# Patient Record
Sex: Male | Born: 1990 | Hispanic: No | Marital: Single | Smoking: Current every day smoker
Health system: Southern US, Community
[De-identification: ages and names within clinical notes are randomized; demographics above are authoritative.]

## PROBLEM LIST (undated history)

## (undated) DIAGNOSIS — R569 Unspecified convulsions: Secondary | ICD-10-CM

---
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
## 2013-12-16 ENCOUNTER — Encounter (HOSPITAL_COMMUNITY): Payer: Self-pay | Admitting: Emergency Medicine

## 2013-12-16 ENCOUNTER — Inpatient Hospital Stay (HOSPITAL_COMMUNITY)
Admission: EM | Admit: 2013-12-16 | Discharge: 2013-12-19 | DRG: 100 | Disposition: A | Discharge status: Home / Self Care | Payer: BC Managed Care – PPO | Attending: Internal Medicine | Admitting: Internal Medicine

## 2013-12-16 ENCOUNTER — Emergency Department (HOSPITAL_COMMUNITY)
Admission: EM | Admit: 2013-12-16 | Discharge: 2013-12-16 | Disposition: A | Discharge status: Home / Self Care | Payer: BC Managed Care – PPO | Attending: Emergency Medicine | Admitting: Emergency Medicine

## 2013-12-16 DIAGNOSIS — R569 Unspecified convulsions: Secondary | ICD-10-CM | POA: Diagnosis present

## 2013-12-16 DIAGNOSIS — G9340 Encephalopathy, unspecified: Secondary | ICD-10-CM | POA: Diagnosis present

## 2013-12-16 DIAGNOSIS — F1210 Cannabis abuse, uncomplicated: Secondary | ICD-10-CM | POA: Diagnosis present

## 2013-12-16 DIAGNOSIS — Z8661 Personal history of infections of the central nervous system: Secondary | ICD-10-CM

## 2013-12-16 DIAGNOSIS — D696 Thrombocytopenia, unspecified: Secondary | ICD-10-CM | POA: Diagnosis present

## 2013-12-16 DIAGNOSIS — G9349 Other encephalopathy: Secondary | ICD-10-CM | POA: Diagnosis present

## 2013-12-16 DIAGNOSIS — Z23 Encounter for immunization: Secondary | ICD-10-CM

## 2013-12-16 DIAGNOSIS — G40909 Epilepsy, unspecified, not intractable, without status epilepticus: Secondary | ICD-10-CM | POA: Diagnosis not present

## 2013-12-16 DIAGNOSIS — G40309 Generalized idiopathic epilepsy and epileptic syndromes, not intractable, without status epilepticus: Secondary | ICD-10-CM | POA: Diagnosis not present

## 2013-12-16 DIAGNOSIS — R509 Fever, unspecified: Secondary | ICD-10-CM | POA: Diagnosis present

## 2013-12-16 DIAGNOSIS — D72829 Elevated white blood cell count, unspecified: Secondary | ICD-10-CM | POA: Diagnosis present

## 2013-12-16 HISTORY — DX: Unspecified convulsions: R56.9

## 2013-12-16 LAB — BASIC METABOLIC PANEL
ANION GAP: 21 — AB (ref 5–15)
BUN: 7 mg/dL (ref 6–23)
CALCIUM: 9.5 mg/dL (ref 8.4–10.5)
CO2: 17 mEq/L — ABNORMAL LOW (ref 19–32)
Chloride: 100 mEq/L (ref 96–112)
Creatinine, Ser: 1.11 mg/dL (ref 0.50–1.35)
GFR calc Af Amer: >90 mL/min (ref >90)
GFR calc non Af Amer: >90 mL/min (ref >90)
GLUCOSE: 110 mg/dL — AB (ref 70–99)
Potassium: 5.5 mEq/L — ABNORMAL HIGH (ref 3.7–5.3)
Sodium: 138 mEq/L (ref 137–147)

## 2013-12-16 LAB — CBC WITH DIFFERENTIAL/PLATELET
BASOS PCT: 0 % (ref 0–1)
Basophils Absolute: 0 10*3/uL (ref 0.0–0.1)
EOS ABS: 0 10*3/uL (ref 0.0–0.7)
EOS PCT: 0 % (ref 0–5)
HEMATOCRIT: 49.5 % (ref 39.0–52.0)
HEMOGLOBIN: 16.4 g/dL (ref 13.0–17.0)
LYMPHS ABS: 1.4 10*3/uL (ref 0.7–4.0)
Lymphocytes Relative: 9 % — ABNORMAL LOW (ref 12–46)
MCH: 28.5 pg (ref 26.0–34.0)
MCHC: 33.1 g/dL (ref 30.0–36.0)
MCV: 86.1 fL (ref 78.0–100.0)
MONO ABS: 1.4 10*3/uL — AB (ref 0.1–1.0)
MONOS PCT: 9 % (ref 3–12)
Neutro Abs: 12.3 10*3/uL — ABNORMAL HIGH (ref 1.7–7.7)
Neutrophils Relative %: 82 % — ABNORMAL HIGH (ref 43–77)
Platelets: 160 10*3/uL (ref 150–400)
RBC: 5.75 MIL/uL (ref 4.22–5.81)
RDW: 13.7 % (ref 11.5–15.5)
WBC: 15.1 10*3/uL — ABNORMAL HIGH (ref 4.0–10.5)

## 2013-12-16 LAB — RAPID URINE DRUG SCREEN, HOSP PERFORMED
AMPHETAMINES: NOT DETECTED
BENZODIAZEPINES: NOT DETECTED
Barbiturates: NOT DETECTED
COCAINE: NOT DETECTED
OPIATES: NOT DETECTED
Tetrahydrocannabinol: POSITIVE — AB

## 2013-12-16 LAB — ETHANOL: Alcohol, Ethyl (B): <11 mg/dL (ref 0–11)

## 2013-12-16 MED ORDER — ONDANSETRON HCL 4 MG/2ML IJ SOLN
4.0000 mg | Freq: Once | INTRAMUSCULAR | Status: AC
  Administered 2013-12-16: 4 mg via INTRAVENOUS
  Filled 2013-12-16: qty 2

## 2013-12-16 NOTE — ED Provider Notes (Signed)
CSN: [id number]     Arrival date & time 12/16/13  2318 History   First MD Initiated Contact with Patient 12/16/13 2324     Chief Complaint  Patient presents with  . Seizures   Patient is a 23 y.o. male presenting with seizures.  Seizures   Patient is a 23 y.o. Male with a previous history of seizures on Keppra who presents to the ED with multiple seizures today.  Patient had 3 seizures prior to morning visit and was seen here in the ED.  Patient was observed at this time and had no further problems.  Per EMS report patient had two more seizures which occurred at home.  Per previous note review patient had 3 seizures earlier today which lasted approximately 30 seconds to a minute.  Patient was post-ictal after seizures.  Seizures did not require any intervention.  Patient is on Keppra for seizures and per previous notes patient has been taking his medications as prescribed.  There is no family at bedside on examination.  Past Medical History  Diagnosis Date  . Seizures    History reviewed. No pertinent past surgical history. History reviewed. No pertinent family history. History  Substance Use Topics  . Smoking status: Never Smoker   . Smokeless tobacco: Not on file  . Alcohol Use: Yes    Review of Systems  Neurological: Positive for seizures.    Unable to perform HPI as patient is post ictal and is not responding to questions appropriately  Allergies  Review of patient's allergies indicates no known allergies.  Home Medications   Prior to Admission medications   Medication Sig Start Date End Date Taking? Authorizing Provider  levETIRAcetam (KEPPRA) 750 MG tablet Take 3,000 mg by mouth daily.    Historical Provider, MD   BP 134/75  Pulse 83  Temp(Src) 101 F (38.3 C) (Rectal)  Resp 14  SpO2 100% Physical Exam  Nursing note and vitals reviewed. Constitutional: He appears well-developed and well-nourished. No distress.  HENT:  Head: Normocephalic and atraumatic.   Mouth/Throat: Oropharynx is clear and moist. No oropharyngeal exudate.  Eyes: Conjunctivae are normal. Pupils are equal, round, and reactive to light. No scleral icterus.  Neck: Normal range of motion. Neck supple. No JVD present. No thyromegaly present.  Cardiovascular: Normal rate, regular rhythm, normal heart sounds and intact distal pulses.  Exam reveals no gallop and no friction rub.   No murmur heard. Pulmonary/Chest: Effort normal and breath sounds normal. No respiratory distress. He has no wheezes. He has no rales. He exhibits no tenderness.  Abdominal: Soft. Bowel sounds are normal. He exhibits no distension and no mass. There is no tenderness. There is no rebound and no guarding.  Musculoskeletal: Normal range of motion.  Lymphadenopathy:    He has no cervical adenopathy.  Neurological:  GCS eyes 4, verbal response 4, motor response 6.  Patient is lethargic and appears to be confused.  Patient is largely non-communicative but occasionally will ask questions.  Patient moves extremities x 4.    Skin: Skin is warm and dry. He is not diaphoretic.  Psychiatric: He has a normal mood and affect. His behavior is normal. Judgment and thought content normal.    ED Course  Procedures (including critical care time) Labs Review Labs Reviewed  CBC WITH DIFFERENTIAL - Abnormal; Notable for the following:    WBC 16.0 (*)    Platelets 147 (*)    Neutrophils Relative % 80 (*)    Neutro Abs 12.8 (*)  Lymphocytes Relative 8 (*)    Monocytes Absolute 1.9 (*)    All other components within normal limits  BASIC METABOLIC PANEL - Abnormal; Notable for the following:    GFR calc non Af Amer 87 (*)    Anion gap 20 (*)    All other components within normal limits  URINALYSIS, ROUTINE W REFLEX MICROSCOPIC  BLOOD GAS, VENOUS  I-STAT CG4 LACTIC ACID, ED    Imaging Review No results found.   EKG Interpretation None      MDM   Final diagnoses:  Seizures   Patient is a 23 y.o. Male who  presents to the ED via EMS for recurrent seizures today.  Patient is post ictal and confused on examination.  History is difficult to obtain due to the fact that there is no family at bedside.  CBC, CMP, lactic acid, and UA ordered here at this time and are currently pending.  Have started an IV and will give the patient NS bolus and will treat with 1g IV keppra here at this time.    1:17 am Patient was rechecked and continues to remain altered and confused.  Patient will not urinate at this time.  Patient to be in and out cathed by nursing.  Patient does appear to have an increasing leukocytosis with an anion gap of 20.  Have spoken with Dr. Anise Salvo at this time who has advised the use of 100 mg of IV vimpat and recommends at least an observation admission.  It is his preference to have th patient transferred to [facility].   2:15 am I spoke with Dr. Toniann Fail who will come to see the patient here in the ED.  Accepting physician at [facility] will likely be Dr. Allena Katz.  [facility] to be transferred [facility] for [facility] treatment.  EMTALA has been performed by me.  2:20 am Patient is now febrile and as the patient likely has not returned to baseline and has a leukocytosis patient needs a CT of the head and an LP.  I have spoken with Dr. Norlene Campbell and have signed the patient out with her at this time.        Eben Burow, PA-C 12/17/13 0222

## 2013-12-16 NOTE — ED Provider Notes (Signed)
CSN: [id number]     Arrival date & time 12/16/13  4098 History   First MD Initiated Contact with Patient 12/16/13 1004     Chief Complaint  Patient presents with  . Seizures     (Consider location/radiation/quality/duration/timing/severity/associated sxs/prior Treatment) HPI Comments: Patient here after having 3 seizures witnessed by family each lasting for possibly 30 seconds to 1 minute. He has a history of seizure disorder and takes Keppra and reportedly has been compliant. EMS arrived and patient was post ictal. Patient denies any recent illnesses. He has had some vomiting that began after he had the seizures. Denies any tongue injury. No loss of bladder function. No focal weakness noted. No treatment used prior to arrival and nothing makes the symptoms better or worse.  Patient is a 23 y.o. male presenting with seizures. The history is provided by the patient.  Seizures   Past Medical History  Diagnosis Date  . Seizures    History reviewed. No pertinent past surgical history. No family history on file. History  Substance Use Topics  . Smoking status: Not on file  . Smokeless tobacco: Not on file  . Alcohol Use: Yes    Review of Systems  Neurological: Positive for seizures.  All other systems reviewed and are negative.     Allergies  Review of patient's allergies indicates no known allergies.  Home Medications   Prior to Admission medications   Not on File   BP 112/55  Pulse 85  Temp(Src) 98.1 F (36.7 C) (Oral)  Resp 18  SpO2 100% Physical Exam  Nursing note and vitals reviewed. Constitutional: He is oriented to person, place, and time. He appears well-developed and well-nourished.  Non-toxic appearance. No distress.  HENT:  Head: Normocephalic and atraumatic.  Eyes: Conjunctivae, EOM and lids are normal. Pupils are equal, round, and reactive to light.  Neck: Normal range of motion. Neck supple. No tracheal deviation present. No mass present.   Cardiovascular: Normal rate, regular rhythm and normal heart sounds.  Exam reveals no gallop.   No murmur heard. Pulmonary/Chest: Effort normal and breath sounds normal. No stridor. No respiratory distress. He has no decreased breath sounds. He has no wheezes. He has no rhonchi. He has no rales.  Abdominal: Soft. Normal appearance and bowel sounds are normal. He exhibits no distension. There is no tenderness. There is no rebound and no CVA tenderness.  Musculoskeletal: Normal range of motion. He exhibits no edema and no tenderness.  Neurological: He is alert and oriented to person, place, and time. He has normal strength. No cranial nerve deficit or sensory deficit. GCS eye subscore is 4. GCS verbal subscore is 5. GCS motor subscore is 6.  Skin: Skin is warm and dry. No abrasion and no rash noted.  Psychiatric: His affect is blunt. His speech is delayed. He is slowed.    ED Course  Procedures (including critical care time) Labs Review Labs Reviewed  CBC WITH DIFFERENTIAL  BASIC METABOLIC PANEL  URINE RAPID DRUG SCREEN (HOSP PERFORMED)  ETHANOL    Imaging Review No results found.   EKG Interpretation None      MDM   Final diagnoses:  None    Patient monitored here no further seizure activity. Spoke with patient's mother and patient recently had his dose of Keppra increased by his neurologist. She will contact him for medication adjustment instructions. Neurological exam stable for discharge.  Toy Baker, MD 12/16/13 1259

## 2013-12-16 NOTE — ED Notes (Signed)
Patient was seen today in our ED for seizures, treated and discharged. Patient has had 2 more seizures at home after discharge. EMS was called.

## 2013-12-16 NOTE — Discharge Instructions (Signed)
Call your Dr. today to have them adjust your dose of Keppra return here if you have another seizure Epilepsy Epilepsy is a disorder in which a person has repeated seizures over time. A seizure is a release of abnormal electrical activity in the brain. Seizures can cause a change in attention, behavior, or the ability to remain awake and alert (altered mental status). Seizures often involve uncontrollable shaking (convulsions).  Most people with epilepsy lead normal lives. However, people with epilepsy are at an increased risk of falls, accidents, and injuries. Therefore, it is important to begin treatment right away. CAUSES  Epilepsy has many possible causes. Anything that disturbs the normal pattern of brain cell activity can lead to seizures. This may include:   Head injury.  Birth trauma.  High fever as a child.  Stroke.  Bleeding into or around the brain.  Certain drugs.  Prolonged low oxygen, such as what occurs after CPR efforts.  Abnormal brain development.  Certain illnesses, such as meningitis, encephalitis (brain infection), malaria, and other infections.  An imbalance of nerve signaling chemicals (neurotransmitters).  SIGNS AND SYMPTOMS  The symptoms of a seizure can vary greatly from one person to another. Right before a seizure, you may have a warning (aura) that a seizure is about to occur. An aura may include the following symptoms:  Fear or anxiety.  Nausea.  Feeling like the room is spinning (vertigo).  Vision changes, such as seeing flashing lights or spots. Common symptoms during a seizure include:  Abnormal sensations, such as an abnormal smell or a bitter taste in the mouth.   Sudden, general body stiffness.   Convulsions that involve rhythmic jerking of the face, arm, or leg on one or both sides.   Sudden change in consciousness.   Appearing to be awake but not responding.   Appearing to be asleep but cannot be awakened.   Grimacing,  chewing, lip smacking, drooling, tongue biting, or loss of bowel or bladder control. After a seizure, you may feel sleepy for a while. DIAGNOSIS  Your health care provider will ask about your symptoms and take a medical history. Descriptions from any witnesses to your seizures will be very helpful in the diagnosis. A physical exam, including a detailed neurological exam, is necessary. Various tests may be done, such as:   An electroencephalogram (EEG). This is a painless test of your brain waves. In this test, a diagram is created of your brain waves. These diagrams can be interpreted by a specialist.  An MRI of the brain.   A CT scan of the brain.   A spinal tap (lumbar puncture, LP).  Blood tests to check for signs of infection or abnormal blood chemistry. TREATMENT  There is no cure for epilepsy, but it is generally treatable. Once epilepsy is diagnosed, it is important to begin treatment as soon as possible. For most people with epilepsy, seizures can be controlled with medicines. The following may also be used:  A pacemaker for the brain (vagus nerve stimulator) can be used for people with seizures that are not well controlled by medicine.  Surgery on the brain. For some people, epilepsy eventually goes away. HOME CARE INSTRUCTIONS   Follow your health care provider's recommendations on driving and safety in normal activities.  Get enough rest. Lack of sleep can cause seizures.  Only take over-the-counter or prescription medicines as directed by your health care provider. Take any prescribed medicine exactly as directed.  Avoid any known triggers of your seizures.  Keep a seizure diary. Record what you recall about any seizure, especially any possible trigger.   Make sure the people you live and work with know that you are prone to seizures. They should receive instructions on how to help you. In general, a witness to a seizure should:   Cushion your head and body.    Turn you on your side.   Avoid unnecessarily restraining you.   Not place anything inside your mouth.   Call for emergency medical help if there is any question about what has occurred.   Follow up with your health care provider as directed. You may need regular blood tests to monitor the levels of your medicine.  SEEK MEDICAL CARE IF:   You develop signs of infection or other illness. This might increase the risk of a seizure.   You seem to be having more frequent seizures.   Your seizure pattern is changing.  SEEK IMMEDIATE MEDICAL CARE IF:   You have a seizure that does not stop after a few moments.   You have a seizure that causes any difficulty in breathing.   You have a seizure that results in a very severe headache.   You have a seizure that leaves you with the inability to speak or use a part of your body.  Document Released: 04/10/2005 Document Revised: 01/29/2013 Document Reviewed: 11/20/2012 [facility] Patient Information 2015 [place], [place]. This information is not intended to replace advice given to you by your health care provider. Make sure you discuss any questions you have with your health care provider.

## 2013-12-16 NOTE — ED Notes (Signed)
Bed: ZO10 Expected date:  Expected time:  Means of arrival:  Comments: ems- seizure

## 2013-12-16 NOTE — ED Notes (Signed)
MD at bedside. 

## 2013-12-16 NOTE — ED Notes (Addendum)
Per EMS: family states that since 0640 this morning pt has had 3 seizures, lasting 30sec - . Has hx of seizures. Per family pt is taking Keppra as prescribed. Pt also having vomiting.

## 2013-12-16 NOTE — ED Notes (Signed)
Pt. Is unable to use the restroom at this time, but is aware that we need a urine specimen. Urinal at the bedside.  

## 2013-12-16 NOTE — Progress Notes (Signed)
  CARE MANAGEMENT ED NOTE 12/16/2013  Patient:  Jack, Suarez   Account Number:  [id number]  Date Initiated:  12/16/2013  Documentation initiated by:  Edd Arbour  Subjective/Objective Assessment:   23 yr old self pay pt (male at bedside reports pt has insurance but none listed- encouraged him to call mother, family to obtain information to be provided to registration to prevent self pay billing)     Subjective/Objective Assessment Detail:   pcp per male at bedside [place] in [place], [place] and it is a male  Cm offered to help find a pcp but male states pt returns to his pcp in [place] but does not know male dr name Does not have insurance card nor copy of insurance card    family states that since 0640 this morning pt has had 3 seizures, lasting 30sec - . Has hx of seizures. Per family pt is taking Keppra as prescribed. Pt also having vomiting     Action/Plan:   ED CM noted pt with out insurance not pcp listed ECD CM left male calling someone on his cell to obtain information to provide to ED registration   Action/Plan Detail:   Anticipated DC Date:  12/16/2013     Status Recommendation to Physician:   Result of Recommendation:    Other ED Services  Consult Working Plan    DC Planning Services  Other  PCP issues  Outpatient Services - Pt will follow up    Choice offered to / List presented to:            Status of service:  Completed, signed off  ED Comments:   ED Comments Detail:

## 2013-12-16 NOTE — ED Notes (Signed)
Bed: ZO10 Expected date: 12/16/13 Expected time: 11:06 PM Means of arrival: Ambulance Comments: Seizures/confused

## 2013-12-17 ENCOUNTER — Inpatient Hospital Stay (HOSPITAL_COMMUNITY): Payer: BC Managed Care – PPO

## 2013-12-17 ENCOUNTER — Emergency Department (HOSPITAL_COMMUNITY): Payer: BC Managed Care – PPO

## 2013-12-17 ENCOUNTER — Encounter (HOSPITAL_COMMUNITY): Payer: Self-pay | Admitting: Internal Medicine

## 2013-12-17 DIAGNOSIS — G40309 Generalized idiopathic epilepsy and epileptic syndromes, not intractable, without status epilepticus: Secondary | ICD-10-CM | POA: Diagnosis present

## 2013-12-17 DIAGNOSIS — R509 Fever, unspecified: Secondary | ICD-10-CM | POA: Diagnosis present

## 2013-12-17 DIAGNOSIS — F1210 Cannabis abuse, uncomplicated: Secondary | ICD-10-CM | POA: Diagnosis present

## 2013-12-17 DIAGNOSIS — R569 Unspecified convulsions: Secondary | ICD-10-CM | POA: Diagnosis present

## 2013-12-17 DIAGNOSIS — G40909 Epilepsy, unspecified, not intractable, without status epilepticus: Secondary | ICD-10-CM | POA: Diagnosis present

## 2013-12-17 DIAGNOSIS — D72829 Elevated white blood cell count, unspecified: Secondary | ICD-10-CM | POA: Diagnosis present

## 2013-12-17 DIAGNOSIS — Z23 Encounter for immunization: Secondary | ICD-10-CM | POA: Diagnosis not present

## 2013-12-17 DIAGNOSIS — G9349 Other encephalopathy: Secondary | ICD-10-CM | POA: Diagnosis present

## 2013-12-17 DIAGNOSIS — Z8661 Personal history of infections of the central nervous system: Secondary | ICD-10-CM | POA: Diagnosis not present

## 2013-12-17 DIAGNOSIS — D696 Thrombocytopenia, unspecified: Secondary | ICD-10-CM | POA: Diagnosis present

## 2013-12-17 DIAGNOSIS — G9340 Encephalopathy, unspecified: Secondary | ICD-10-CM | POA: Diagnosis present

## 2013-12-17 LAB — URINALYSIS, ROUTINE W REFLEX MICROSCOPIC
BILIRUBIN URINE: NEGATIVE
GLUCOSE, UA: NEGATIVE mg/dL
Hgb urine dipstick: NEGATIVE
KETONES UR: 40 mg/dL — AB
Leukocytes, UA: NEGATIVE
Nitrite: NEGATIVE
PH: 5.5 (ref 5.0–8.0)
Protein, ur: NEGATIVE mg/dL
Specific Gravity, Urine: 1.014 (ref 1.005–1.030)
Urobilinogen, UA: 0.2 mg/dL (ref 0.0–1.0)

## 2013-12-17 LAB — BLOOD GAS, VENOUS
Acid-base deficit: 1.9 mmol/L (ref 0.0–2.0)
BICARBONATE: 22.1 meq/L (ref 20.0–24.0)
FIO2: 0.21 %
O2 Saturation: 76 %
PCO2 VEN: 39.9 mmHg — AB (ref 45.0–50.0)
PH VEN: 7.37 — AB (ref 7.250–7.300)
Patient temperature: 101
TCO2: 19 mmol/L (ref 0–100)
pO2, Ven: 45.1 mmHg — ABNORMAL HIGH (ref 30.0–45.0)

## 2013-12-17 LAB — GLUCOSE, CAPILLARY
GLUCOSE-CAPILLARY: 86 mg/dL (ref 70–99)
Glucose-Capillary: 85 mg/dL (ref 70–99)
Glucose-Capillary: 76 mg/dL (ref 70–99)
Glucose-Capillary: 73 mg/dL (ref 70–99)

## 2013-12-17 LAB — LACTIC ACID, PLASMA: Lactic Acid, Venous: 1.3 mmol/L (ref 0.5–2.2)

## 2013-12-17 LAB — CBC WITH DIFFERENTIAL/PLATELET
BASOS ABS: 0 10*3/uL (ref 0.0–0.1)
BASOS PCT: 0 % (ref 0–1)
Basophils Absolute: 0 10*3/uL (ref 0.0–0.1)
Basophils Relative: 0 % (ref 0–1)
EOS ABS: 0 10*3/uL (ref 0.0–0.7)
EOS PCT: 0 % (ref 0–5)
Eosinophils Absolute: 0 10*3/uL (ref 0.0–0.7)
Eosinophils Relative: 0 % (ref 0–5)
HCT: 44.3 % (ref 39.0–52.0)
HEMATOCRIT: 50 % (ref 39.0–52.0)
Hemoglobin: 15.4 g/dL (ref 13.0–17.0)
Hemoglobin: 17 g/dL (ref 13.0–17.0)
LYMPHS PCT: 8 % — AB (ref 12–46)
Lymphocytes Relative: 18 % (ref 12–46)
Lymphs Abs: 1.3 10*3/uL (ref 0.7–4.0)
Lymphs Abs: 2.8 10*3/uL (ref 0.7–4.0)
MCH: 29.7 pg (ref 26.0–34.0)
MCH: 29.9 pg (ref 26.0–34.0)
MCHC: 34 g/dL (ref 30.0–36.0)
MCHC: 34.8 g/dL (ref 30.0–36.0)
MCV: 86 fL (ref 78.0–100.0)
MCV: 87.4 fL (ref 78.0–100.0)
MONO ABS: 1.9 10*3/uL — AB (ref 0.1–1.0)
MONOS PCT: 13 % — AB (ref 3–12)
Monocytes Absolute: 2 10*3/uL — ABNORMAL HIGH (ref 0.1–1.0)
Monocytes Relative: 12 % (ref 3–12)
NEUTROS PCT: 69 % (ref 43–77)
Neutro Abs: 10.3 10*3/uL — ABNORMAL HIGH (ref 1.7–7.7)
Neutro Abs: 12.8 10*3/uL — ABNORMAL HIGH (ref 1.7–7.7)
Neutrophils Relative %: 80 % — ABNORMAL HIGH (ref 43–77)
Platelets: 140 10*3/uL — ABNORMAL LOW (ref 150–400)
Platelets: 147 10*3/uL — ABNORMAL LOW (ref 150–400)
RBC: 5.15 MIL/uL (ref 4.22–5.81)
RBC: 5.72 MIL/uL (ref 4.22–5.81)
RDW: 13.7 % (ref 11.5–15.5)
RDW: 13.7 % (ref 11.5–15.5)
WBC: 15.1 10*3/uL — ABNORMAL HIGH (ref 4.0–10.5)
WBC: 16 10*3/uL — AB (ref 4.0–10.5)

## 2013-12-17 LAB — COMPREHENSIVE METABOLIC PANEL
ALK PHOS: 46 U/L (ref 39–117)
ALT: 16 U/L (ref 0–53)
AST: 66 U/L — ABNORMAL HIGH (ref 0–37)
Albumin: 3.6 g/dL (ref 3.5–5.2)
Anion gap: 14 (ref 5–15)
BUN: 7 mg/dL (ref 6–23)
CHLORIDE: 104 meq/L (ref 96–112)
CO2: 21 meq/L (ref 19–32)
Calcium: 8.6 mg/dL (ref 8.4–10.5)
Creatinine, Ser: 1.12 mg/dL (ref 0.50–1.35)
GFR calc non Af Amer: >90 mL/min (ref >90)
GLUCOSE: 90 mg/dL (ref 70–99)
Potassium: 3.7 mEq/L (ref 3.7–5.3)
SODIUM: 139 meq/L (ref 137–147)
Total Bilirubin: 0.7 mg/dL (ref 0.3–1.2)
Total Protein: 6.7 g/dL (ref 6.0–8.3)

## 2013-12-17 LAB — BASIC METABOLIC PANEL
Anion gap: 20 — ABNORMAL HIGH (ref 5–15)
BUN: 8 mg/dL (ref 6–23)
CALCIUM: 9.4 mg/dL (ref 8.4–10.5)
CO2: 19 meq/L (ref 19–32)
CREATININE: 1.18 mg/dL (ref 0.50–1.35)
Chloride: 100 mEq/L (ref 96–112)
GFR calc Af Amer: >90 mL/min (ref >90)
GFR calc non Af Amer: 87 mL/min — ABNORMAL LOW (ref >90)
GLUCOSE: 90 mg/dL (ref 70–99)
Potassium: 4.6 mEq/L (ref 3.7–5.3)
Sodium: 139 mEq/L (ref 137–147)

## 2013-12-17 LAB — I-STAT CG4 LACTIC ACID, ED: Lactic Acid, Venous: 3.74 mmol/L — ABNORMAL HIGH (ref 0.5–2.2)

## 2013-12-17 LAB — MRSA PCR SCREENING: MRSA by PCR: NEGATIVE

## 2013-12-17 LAB — URINE MICROSCOPIC-ADD ON

## 2013-12-17 MED ORDER — ACETAMINOPHEN 325 MG PO TABS
650.0000 mg | ORAL_TABLET | Freq: Four times a day (QID) | ORAL | Status: DC | PRN
  Administered 2013-12-18: 650 mg via ORAL
  Filled 2013-12-17: qty 2

## 2013-12-17 MED ORDER — SODIUM CHLORIDE 0.9 % IV SOLN
INTRAVENOUS | Status: DC
  Administered 2013-12-17: 75 mL/h via INTRAVENOUS

## 2013-12-17 MED ORDER — ACETAMINOPHEN 650 MG RE SUPP: 650.0000 mg | Freq: Four times a day (QID) | RECTAL | Status: DC | PRN

## 2013-12-17 MED ORDER — VALPROATE SODIUM 500 MG/5ML IV SOLN
500.0000 mg | Freq: Three times a day (TID) | INTRAVENOUS | Status: DC
  Administered 2013-12-17 – 2013-12-18 (×3): 500 mg via INTRAVENOUS
  Filled 2013-12-17 (×4): qty 5

## 2013-12-17 MED ORDER — DEXTROSE 5 % IV SOLN
2.0000 g | Freq: Once | INTRAVENOUS | Status: AC
  Administered 2013-12-17: 2 g via INTRAVENOUS
  Filled 2013-12-17: qty 2

## 2013-12-17 MED ORDER — VANCOMYCIN HCL IN DEXTROSE 1-5 GM/200ML-% IV SOLN
1000.0000 mg | Freq: Once | INTRAVENOUS | Status: DC
  Filled 2013-12-17: qty 200

## 2013-12-17 MED ORDER — PROPOFOL 10 MG/ML IV BOLUS
INTRAVENOUS | Status: AC
  Filled 2013-12-17: qty 1

## 2013-12-17 MED ORDER — LEVETIRACETAM IN NACL 1000 MG/100ML IV SOLN
1000.0000 mg | Freq: Once | INTRAVENOUS | Status: AC
  Administered 2013-12-17: 1000 mg via INTRAVENOUS
  Filled 2013-12-17: qty 100

## 2013-12-17 MED ORDER — DEXTROSE 5 % IV SOLN
2.0000 g | Freq: Two times a day (BID) | INTRAVENOUS | Status: DC
  Administered 2013-12-18 (×2): 2 g via INTRAVENOUS
  Filled 2013-12-17 (×3): qty 2

## 2013-12-17 MED ORDER — SODIUM CHLORIDE 0.9 % IV BOLUS (SEPSIS)
1000.0000 mL | Freq: Once | INTRAVENOUS | Status: AC
  Administered 2013-12-17: 1000 mL via INTRAVENOUS

## 2013-12-17 MED ORDER — ONDANSETRON HCL 4 MG PO TABS: 4.0000 mg | ORAL_TABLET | Freq: Four times a day (QID) | ORAL | Status: DC | PRN

## 2013-12-17 MED ORDER — VANCOMYCIN HCL IN DEXTROSE 1-5 GM/200ML-% IV SOLN
1000.0000 mg | Freq: Three times a day (TID) | INTRAVENOUS | Status: DC
  Administered 2013-12-17 – 2013-12-18 (×4): 1000 mg via INTRAVENOUS
  Filled 2013-12-17 (×7): qty 200

## 2013-12-17 MED ORDER — DEXTROSE 5 % IV SOLN
10.0000 mg/kg | Freq: Three times a day (TID) | INTRAVENOUS | Status: DC
  Administered 2013-12-17 – 2013-12-18 (×5): 750 mg via INTRAVENOUS
  Filled 2013-12-17 (×9): qty 15

## 2013-12-17 MED ORDER — PNEUMOCOCCAL VAC POLYVALENT 25 MCG/0.5ML IJ INJ
0.5000 mL | INJECTION | INTRAMUSCULAR | Status: DC
  Filled 2013-12-17 (×2): qty 0.5

## 2013-12-17 MED ORDER — LEVETIRACETAM IN NACL 1500 MG/100ML IV SOLN
1500.0000 mg | Freq: Two times a day (BID) | INTRAVENOUS | Status: DC
  Administered 2013-12-17 – 2013-12-18 (×3): 1500 mg via INTRAVENOUS
  Filled 2013-12-17 (×4): qty 100

## 2013-12-17 MED ORDER — PROPOFOL 10 MG/ML IV BOLUS
INTRAVENOUS | Status: AC | PRN
  Administered 2013-12-17: 30 mg via INTRAVENOUS

## 2013-12-17 MED ORDER — KETAMINE HCL 10 MG/ML IJ SOLN
INTRAMUSCULAR | Status: AC | PRN
  Administered 2013-12-17: 110 mg via INTRAVENOUS

## 2013-12-17 MED ORDER — VALPROATE SODIUM 500 MG/5ML IV SOLN
500.0000 mg | Freq: Three times a day (TID) | INTRAVENOUS | Status: DC
  Administered 2013-12-17: 500 mg via INTRAVENOUS
  Filled 2013-12-17 (×5): qty 5

## 2013-12-17 MED ORDER — LORAZEPAM 2 MG/ML IJ SOLN
1.0000 mg | INTRAMUSCULAR | Status: DC | PRN
  Filled 2013-12-17 (×2): qty 1

## 2013-12-17 MED ORDER — KETAMINE HCL 10 MG/ML IJ SOLN: 1.5000 mg/kg | Freq: Once | INTRAMUSCULAR | Status: DC

## 2013-12-17 MED ORDER — SODIUM CHLORIDE 0.9 % IV SOLN: 100.0000 mg | Freq: Two times a day (BID) | INTRAVENOUS | Status: DC

## 2013-12-17 MED ORDER — SODIUM CHLORIDE 0.9 % IV SOLN
100.0000 mg | Freq: Two times a day (BID) | INTRAVENOUS | Status: DC
  Administered 2013-12-17: 100 mg via INTRAVENOUS
  Filled 2013-12-17 (×3): qty 10

## 2013-12-17 MED ORDER — ONDANSETRON HCL 4 MG/2ML IJ SOLN: 4.0000 mg | Freq: Four times a day (QID) | INTRAMUSCULAR | Status: DC | PRN

## 2013-12-17 MED ORDER — SODIUM CHLORIDE 0.9 % IV SOLN
INTRAVENOUS | Status: AC | PRN
  Administered 2013-12-17: 50 mL/h via INTRAVENOUS

## 2013-12-17 MED ORDER — SODIUM CHLORIDE 0.9 % IV SOLN
INTRAVENOUS | Status: DC
  Administered 2013-12-17: 07:00:00 via INTRAVENOUS

## 2013-12-17 MED ORDER — PROPOFOL 10 MG/ML IV BOLUS
0.5000 mg/kg | Freq: Once | INTRAVENOUS | Status: DC
  Filled 2013-12-17: qty 1

## 2013-12-17 MED ORDER — LORAZEPAM 2 MG/ML IJ SOLN
1.0000 mg | Freq: Once | INTRAMUSCULAR | Status: AC
  Administered 2013-12-17: 1 mg via INTRAVENOUS
  Filled 2013-12-17: qty 1

## 2013-12-17 MED ORDER — PROPOFOL 10 MG/ML IV BOLUS
INTRAVENOUS | Status: AC | PRN
  Administered 2013-12-17: 100 mg via INTRAVENOUS

## 2013-12-17 MED ORDER — PROPOFOL 10 MG/ML IV BOLUS
INTRAVENOUS | Status: AC | PRN
  Administered 2013-12-17: 70 mg via INTRAVENOUS

## 2013-12-17 NOTE — Progress Notes (Signed)
ANTIBIOTIC CONSULT NOTE - INITIAL  Pharmacy Consult for Acyclovir Indication: concern for encephalitis   No Known Allergies  Patient Measurements: Height:  (185.4 cm) Weight: 165 lb (74.844 kg) IBW/kg (Calculated) : 79.9 Adjusted Body Weight:   Vital Signs: Temp: 101 F (38.3 C) (08/26 0135) Temp src: Rectal (08/26 0135) BP: 107/55 mmHg (08/26 0200) Pulse Rate: 70 (08/26 0200) Intake/Output from previous day:   Intake/Output from this shift:    Labs:  Recent Labs  12/16/13 1025 12/17/13 0027  WBC 15.1* 16.0*  HGB 16.4 17.0  PLT 160 147*  CREATININE 1.11 1.18   Estimated Creatinine Clearance: 104.8 ml/min (by C-G formula based on Cr of 1.18). No results found for this basename: VANCOTROUGH, VANCOPEAK, VANCORANDOM, GENTTROUGH, GENTPEAK, GENTRANDOM, TOBRATROUGH, TOBRAPEAK, TOBRARND, AMIKACINPEAK, AMIKACINTROU, AMIKACIN,  in the last 72 hours   Microbiology: No results found for this or any previous visit (from the past 720 hour(s)).  Medical History: Past Medical History  Diagnosis Date  . Seizures     Medications:  Anti-infectives   Start     Dose/Rate Route Frequency Ordered Stop   12/17/13 0330  acyclovir (ZOVIRAX) 750 mg in dextrose 5 % 150 mL IVPB     10 mg/kg  74.8 kg 165 mL/hr over 60 Minutes Intravenous 3 times per day 12/17/13 0319     12/17/13 0245  vancomycin (VANCOCIN) IVPB 1000 mg/200 mL premix     1,000 mg 200 mL/hr over 60 Minutes Intravenous  Once 12/17/13 0232     12/17/13 0245  cefTRIAXone (ROCEPHIN) 2 g in dextrose 5 % 50 mL IVPB     2 g 100 mL/hr over 30 Minutes Intravenous  Once 12/17/13 0232       Assessment: Patient with concern for encephalitis.    Goal of Therapy:  Acyclovir dosed based on patient weight and renal function   Plan:  Acyclovir  iv q8hr  Darlina Guys, Abdon Petrosky [place] 12/17/2013,3:19 AM

## 2013-12-17 NOTE — ED Provider Notes (Signed)
Medical screening examination/treatment/procedure(s) were conducted as a shared visit with non-physician practitioner(s) and myself.  I personally evaluated the patient during the encounter.   EKG Interpretation None     Please see my note from this date  Olivia Mackie, MD 12/17/13 [phone]

## 2013-12-17 NOTE — Progress Notes (Signed)
ANTIBIOTIC CONSULT NOTE - INITIAL  Pharmacy Consult for Vancomycin/Ceftriaxone  Indication: r/o meningitis  No Known Allergies  Patient Measurements: Height:  (185.4 cm) Weight: 165 lb (74.844 kg) IBW/kg (Calculated) : 79.9  Vital Signs: Temp: 101 F (38.3 C) (08/26 0135) Temp src: Rectal (08/26 0135) BP: 124/62 mmHg (08/26 0523) Pulse Rate: 95 (08/26 0523)  Labs:  Recent Labs  12/16/13 1025 12/17/13 0027  WBC 15.1* 16.0*  HGB 16.4 17.0  PLT 160 147*  CREATININE 1.11 1.18   Estimated Creatinine Clearance: 104.8 ml/min (by C-G formula based on Cr of 1.18).  Medical History: Past Medical History  Diagnosis Date  . Seizures     Assessment: 23 y/o M to start empiric antibiotics for r/o meningitis. Already on acyclovir. WBC 16, Tmax 101, other labs as above.   Goal of Therapy:  Vancomycin trough level 15-20 mcg/ml  Plan:  -Vancomycin 1000 mg IV q8h -Ceftriaxone 2g IV q12h -Trend WBC, temp, renal function  -Drug levels as indicated -F/U meningitis w/u  Abran Duke 12/17/2013,6:28 AM

## 2013-12-17 NOTE — H&P (Signed)
[facility] History and Physical  Jack Suarez WUJ:[id number] DOB: May 11, 1990 DOA: 12/16/2013  Referring physician: ER physician. PCP: No primary provider on file.   History obtained from ER physician and patient's mother as patient is post ictal and confused.  Chief Complaint: Seizures.  HPI: Jack Suarez is a 23 y.o. male with history of seizures was brought to the ER after patient had seizures at his house. Patient had originally come to the ER yesterday morning for seizures and was observed in the ER and discharged home. Following which patient had 2 more episodes of seizures which as per patient's mother lasted for less than a minute each and was tonic-clonic. EMS was called and patient was brought to the ER. On-call neurologist Dr. Amada Jupiter was consulted by EDP and patient was given a loading dose of Keppra and Dr. Amada Jupiter advised to add Vimpat and lumbar puncture. Patient is mildly febrile. Lumbar puncture has been planned and is pending. CT head did not show anything acute but did show parietal bone thinning. As per patient's mother patient's neurologist had recently increased patient's Keppra to 3000 mg daily 2 weeks ago since patient was having increased frequency of seizures. Patient has history of meningitis when he was 27 months old.  Review of Systems: As presented in the history of presenting illness, rest negative.  Past Medical History  Diagnosis Date  . Seizures    History reviewed. No pertinent past surgical history. Social History:  reports that he has never smoked. He does not have any smokeless tobacco history on file. He reports that he drinks alcohol. His drug history is not on file. Where does patient live home. Can patient participate in ADLs? Yes.  No Known Allergies  Family History:  Family History  Problem Relation Age of Onset  . Family history unknown: Yes      Prior to Admission medications   Medication Sig Start Date End Date  Taking? Authorizing Provider  levETIRAcetam (KEPPRA) 750 MG tablet Take 3,000 mg by mouth daily.    Historical Provider, MD    Physical Exam: Filed Vitals:   12/17/13 0130 12/17/13 0135 12/17/13 0200 12/17/13 0314  BP: 123/83  107/55   Pulse:   70   Temp:  101 F (38.3 C)    TempSrc:  Rectal    Resp: 25  23   Height:     (1.854 m)  Weight:    74.844 kg (165 lb)  SpO2:   98%      General:  Well-developed and nourished.  Eyes: Perla positive. Anicteric no pallor.  ENT: No discharge from the ears eyes nose mouth.  Neck: No mass felt. No neck rigidity.  Cardiovascular: S1-S2 heard.  Respiratory: No rhonchi or crepitations.  Abdomen: Soft nontender bowel sounds present. No guarding rigidity.  Skin: No rash.  Musculoskeletal: No edema.  Psychiatric: Patient is lethargic and postictal.  Neurologic: Patient is lethargic and postictal. Difficult to assess neurologically.  Labs on Admission:  Basic Metabolic Panel:  Recent Labs Lab 12/16/13 1025 12/17/13 0027  NA 138 139  K 5.5* 4.6  CL 100 100  CO2 17* 19  GLUCOSE 110* 90  BUN 7 8  CREATININE 1.11 1.18  CALCIUM 9.5 9.4   Liver Function Tests: No results found for this basename: AST, ALT, ALKPHOS, BILITOT, PROT, ALBUMIN,  in the last 168 hours No results found for this basename: LIPASE, AMYLASE,  in the last 168 hours No results found for this basename: AMMONIA,  in the  last 168 hours CBC:  Recent Labs Lab 12/16/13 1025 12/17/13 0027  WBC 15.1* 16.0*  NEUTROABS 12.3* 12.8*  HGB 16.4 17.0  HCT 49.5 50.0  MCV 86.1 87.4  PLT 160 147*   Cardiac Enzymes: No results found for this basename: CKTOTAL, CKMB, CKMBINDEX, TROPONINI,  in the last 168 hours  BNP (last 3 results) No results found for this basename: PROBNP,  in the last 8760 hours CBG: No results found for this basename: GLUCAP,  in the last 168 hours  Radiological Exams on Admission: Ct Head Wo Contrast  12/17/2013   CLINICAL DATA:   Multiple seizures.  EXAM: CT HEAD WITHOUT CONTRAST  TECHNIQUE: Contiguous axial images were obtained from the base of the skull through the vertex without intravenous contrast.  COMPARISON:  CT head October 04, 2013, CT head January 04, 2010  FINDINGS: The ventricles and sulci are normal. No intraparenchymal hemorrhage, mass effect nor midline shift. Suspected cortical thinning of the right parietal lobe, for example axial 21/33. No acute large vascular territory infarcts.  No abnormal extra-axial fluid collections. Basal cisterns are patent.  No skull fracture. The included ocular globes and orbital contents are non-suspicious. The mastoid aircells and included paranasal sinuses are well-aerated.  IMPRESSION: Motion degraded examination, no acute intracranial process.  Suspected cortical thinning of the right parietal lobe, this may reflect encephalomalacia/remote traumatic etiology. Findings would be better characterized on MRI of the brain on a nonemergent basis.   Electronically Signed   By: Awilda Metro   On: 12/17/2013 03:24     Assessment/Plan Principal Problem:   Seizures Active Problems:   Leucocytosis   Encephalopathy acute   1. Seizures - I have discussed with on-call neurologist Dr. Amada Jupiter who has advised to continue with Keppra 1500 mg IV q 12 hourly and Vimpat 100 mg IV every every 12 hourly. Lumbar puncture labs are pending. Further recommendations per neurologist. Patient has been empirically placed on antibiotics for meningitis/encephalitis. Follow lumbar puncture labs. MRI brain. When necessary Ativan for seizures. Patient will be transferred [facility] for further management. Patient's mother is in agreement to transfer. Dr. Allena Katz will be the accepting physician. Further recommendations per neurologist. 2. Acute encephalopathy - probably secondary to seizures and postictal state. 3. Leukocytosis - probably reactionary. Patient is presently been on antibiotics.  Follow CBC and lumbar puncture labs. 4. Mild thrombocytopenia - closely follow CBC. 5. Marijuana positive.    Code Status: Full code.  Family Communication: Patient's mother at the bedside.  Disposition Plan: Admit to inpatient.    Jack Suarez N. [facility] Pager [phone].  If 7PM-7AM, please contact night-coverage [email or web address] Password [facility] 12/17/2013, 3:39 AM

## 2013-12-17 NOTE — ED Provider Notes (Signed)
Medical screening examination/treatment/procedure(s) were conducted as a shared visit with non-physician practitioner(s) and myself.  I personally evaluated the patient during the encounter.   EKG Interpretation None      Pt seen earlier today for multiple seizures, has h/o seizure disorder.  After d/c, has had additional seizures, has not returned to baseline.  Pt care d/w neurology who rec's admission and addition of vimpat.  Pt to be admitted to hospitalist, but noted to have rectal temp of 101.  CT head ordered, LP discussed with mother who agrees.    Results for orders placed during the hospital encounter of 12/16/13  CBC WITH DIFFERENTIAL      Result Value Ref Range   WBC 16.0 (*) 4.0 - 10.5 K/uL   RBC 5.72  4.22 - 5.81 MIL/uL   Hemoglobin 17.0  13.0 - 17.0 g/dL   HCT 16.1  09.6 - 04.5 %   MCV 87.4  78.0 - 100.0 fL   MCH 29.7  26.0 - 34.0 pg   MCHC 34.0  30.0 - 36.0 g/dL   RDW 40.9  81.1 - 91.4 %   Platelets 147 (*) 150 - 400 K/uL   Neutrophils Relative % 80 (*) 43 - 77 %   Neutro Abs 12.8 (*) 1.7 - 7.7 K/uL   Lymphocytes Relative 8 (*) 12 - 46 %   Lymphs Abs 1.3  0.7 - 4.0 K/uL   Monocytes Relative 12  3 - 12 %   Monocytes Absolute 1.9 (*) 0.1 - 1.0 K/uL   Eosinophils Relative 0  0 - 5 %   Eosinophils Absolute 0.0  0.0 - 0.7 K/uL   Basophils Relative 0  0 - 1 %   Basophils Absolute 0.0  0.0 - 0.1 K/uL  BASIC METABOLIC PANEL      Result Value Ref Range   Sodium 139  137 - 147 mEq/L   Potassium 4.6  3.7 - 5.3 mEq/L   Chloride 100  96 - 112 mEq/L   CO2 19  19 - 32 mEq/L   Glucose, Bld 90  70 - 99 mg/dL   BUN 8  6 - 23 mg/dL   Creatinine, Ser 7.82  0.50 - 1.35 mg/dL   Calcium 9.4  8.4 - 95.6 mg/dL   GFR calc non Af Amer 87 (*) >90 mL/min   GFR calc Af Amer >90  >90 mL/min   Anion gap 20 (*) 5 - 15  URINALYSIS, ROUTINE W REFLEX MICROSCOPIC      Result Value Ref Range   Color, Urine YELLOW  YELLOW   APPearance TURBID (*) CLEAR   Specific Gravity, Urine 1.014  1.005 -  1.030   pH 5.5  5.0 - 8.0   Glucose, UA NEGATIVE  NEGATIVE mg/dL   Hgb urine dipstick NEGATIVE  NEGATIVE   Bilirubin Urine NEGATIVE  NEGATIVE   Ketones, ur 40 (*) NEGATIVE mg/dL   Protein, ur NEGATIVE  NEGATIVE mg/dL   Urobilinogen, UA 0.2  0.0 - 1.0 mg/dL   Nitrite NEGATIVE  NEGATIVE   Leukocytes, UA NEGATIVE  NEGATIVE  BLOOD GAS, VENOUS      Result Value Ref Range   FIO2 0.21     pH, Ven 7.370 (*) 7.250 - 7.300   pCO2, Ven 39.9 (*) 45.0 - 50.0 mmHg   pO2, Ven 45.1 (*) 30.0 - 45.0 mmHg   Bicarbonate 22.1  20.0 - 24.0 mEq/L   TCO2 19.0  0 - 100 mmol/L   Acid-base deficit 1.9  0.0 -  2.0 mmol/L   O2 Saturation 76.0     Patient temperature 101.0     Collection site VEIN     Drawn by COLLECTED BY LABORATORY     Sample type VENOUS    I-STAT CG4 LACTIC ACID, ED      Result Value Ref Range   Lactic Acid, Venous 3.74 (*) 0.5 - 2.2 mmol/L   Ct Head Wo Contrast  12/17/2013   CLINICAL DATA:  Multiple seizures.  EXAM: CT HEAD WITHOUT CONTRAST  TECHNIQUE: Contiguous axial images were obtained from the base of the skull through the vertex without intravenous contrast.  COMPARISON:  CT head October 04, 2013, CT head January 04, 2010  FINDINGS: The ventricles and sulci are normal. No intraparenchymal hemorrhage, mass effect nor midline shift. Suspected cortical thinning of the right parietal lobe, for example axial 21/33. No acute large vascular territory infarcts.  No abnormal extra-axial fluid collections. Basal cisterns are patent.  No skull fracture. The included ocular globes and orbital contents are non-suspicious. The mastoid aircells and included paranasal sinuses are well-aerated.  IMPRESSION: Motion degraded examination, no acute intracranial process.  Suspected cortical thinning of the right parietal lobe, this may reflect encephalomalacia/remote traumatic etiology. Findings would be better characterized on MRI of the brain on a nonemergent basis.   Electronically Signed   By: Awilda Metro   On: 12/17/2013 03:24    4:12 AM Attempted LP, pt unable to stay still.  D/w mother procedural sedation.  Began with propofol, but unable to get good sedation, BP became soft.  Will switch to ketamine and try again.  4:48 AM Better sedation with ketamine, but despite multiple attempts, unable to get CSF.  D/w Dr Amada Jupiter who agrees with starting antibiotics.  D/w Dr Allena Katz who will order fluoro LP.  CRITICAL CARE Performed by: Olivia Mackie Total critical care time: 90 min Critical care time was exclusive of separately billable procedures and treating other patients. Critical care was necessary to treat or prevent imminent or life-threatening deterioration. Critical care was time spent personally by me on the following activities: development of treatment plan with patient and/or surrogate as well as nursing, discussions with consultants, evaluation of patient's response to treatment, examination of patient, obtaining history from patient or surrogate, ordering and performing treatments and interventions, ordering and review of laboratory studies, ordering and review of radiographic studies, pulse oximetry and re-evaluation of patient's condition.  LUMBAR PUNCTURE Date/Time: 12/17/2013 4:53 AM Performed by: Olivia Mackie Authorized by: Olivia Mackie Consent: Verbal consent obtained. written consent obtained. Risks and benefits: risks, benefits and alternatives were discussed Consent given by: parent Required items: required blood products, implants, devices, and special equipment available Patient identity confirmed: verbally with patient Time out: Immediately prior to procedure a "time out" was called to verify the correct patient, procedure, equipment, support staff and site/side marked as required. Indications: evaluation for infection and evaluation for altered mental status Anesthesia: local infiltration Local anesthetic: lidocaine 1% with epinephrine Anesthetic total: 10  ml Patient sedated: yes Sedation type: moderate (conscious) sedation Sedatives: propofol and ketamine Vitals: Vital signs were monitored during sedation. Preparation: Patient was prepped and draped in the usual sterile fashion. Lumbar space: L3-L4 interspace Patient's position: left lateral decubitus Needle gauge: 20 Needle length: 1.5 in Number of attempts: 5 or more Patient tolerance: Patient tolerated the procedure well with no immediate complications. Comments: Unable to complete procedure     Olivia Mackie, MD 12/17/13 [phone]

## 2013-12-17 NOTE — ED Notes (Signed)
Pt's mother, Burna Mortimer, can be contacted at [phone].

## 2013-12-17 NOTE — Consult Note (Signed)
Neurology Consultation Reason for Consult: Seizures Referring Physician: Toniann Fail, A  CC: Seizures  History is obtained from: Patient, medical record  HPI: Jack Suarez is a 23 y.o. male with a previous history of seizures managed on Keppra in the past who presents with recurrent seizures and confusion. He apparently was in his normal state of health until yesterday and had 2 seizures in the morning and came into the emergency department. He went home, where he had 3 more seizures and was not coming back to normal and therefore his mom brought him back into the emergency room. He had his Keppra increase 2 weeks ago to 3000 mg per day.  He was given a gram of Keppra the ER. While in the ER, he spiked a fever to 101 and has been persistently confused without return to baseline.  WUJ:[id number] to obtain due to altered mental status.  Past Medical History  Diagnosis Date  . Seizures     Family History: Father-seizures  Social History: Tob: Denies cigarettes, occasional alcohol  Exam: Current vital signs: BP 124/62  Pulse 95  Temp(Src) 101 F (38.3 C) (Rectal)  Resp 14  Ht  (1.854 m)  Wt 74.844 kg (165 lb)  BMI 21.77 kg/m2  SpO2 99% Vital signs in last 24 hours: Temp:  [98.1 F (36.7 C)-101 F (38.3 C)] 101 F (38.3 C) (08/26 0135) Pulse Rate:  [68-100] 95 (08/26 0523) Resp:  [12-25] 14 (08/26 0523) BP: (98-170)/(44-94) 124/62 mmHg (08/26 0523) SpO2:  [96 %-100 %] 99 % (08/26 0523) Weight:  [74.844 kg (165 lb)] 74.844 kg (165 lb) (08/26 0314)  General: In bed, NAD CV: Regular rate and rhythm Mental Status: Patient is awake, he is slow to respond to questions and commands, often needing repeating of the questioning command. No signs of aphasia or neglect Cranial Nerves: II: Visual Fields are full. Pupils are equal, round, and reactive to light.  Discs are difficult to visualize. III,IV, VI: EOMI without ptosis or diploplia.  V: Facial sensation is symmetric to  temperature VII: Facial movement is symmetric.  VIII: hearing is intact to voice X: Uvula elevates symmetrically XI: Shoulder shrug is symmetric. XII: tongue is midline without atrophy or fasciculations.  Motor: Tone is normal. Bulk is normal. 5/5 strength was present in all four extremities.  Sensory: Sensation is symmetric to light touch and temperature in the arms and legs. Deep Tendon Reflexes: 2+ and symmetric in the biceps and patellae.  Cerebellar: FNF intact bilaterally Gait: Unable to obtain due to patient safety concerns   I have reviewed labs in epic and the results pertinent to this consultation are: Glucose 110, normal sodium  I have reviewed the images obtained: CT head-no acute findings  Impression: 23 year old male with a history of seizures since at least 2011. My suspicion is that his current presentation is due to breakthrough seizures in the setting of some physiological stressor (eg viral infection ). CNS infection, however can't be ruled out because he is febrile with altered mental status and seizures. I agree with obtaining a lumbar puncture and since this is unable to be obtained at the bedside, will need to perform under radiology.  Recommendations: 1) vimpat  bid 2) keppra 1500 mg bid, will need to confirm he was on ER dosing if taking it only as a single daily dose. 3) LP with cells, glucose, protein, ages fever PCR, cultures 4) EEG 5) agree with empiric antibiotic coverage until LP can be obtained  Ritta Slot, MD [facility] [phone]  If 7pm- 7am, please page neurology on call as listed in [place].

## 2013-12-17 NOTE — Progress Notes (Signed)
Utilization Review Completed.  

## 2013-12-17 NOTE — ED Notes (Signed)
Lactic Acid CG4 was given to Dr. Norlene Campbell.

## 2013-12-17 NOTE — Progress Notes (Signed)
Accompanied patient via bed to radiology for MRI of brain w/o contrast.  Technician was required to ask patient several times to stay completely still during procedure, but patient turned his head several times, resulting in a prolongation of the procedure.  Was due to have an LP via fluoroscopy, but patient was unable to remain still for even a brief period of time.  MD stated that the patient was noncompliant with the steps necessary to maintain safety while performing the procedure, so it would be completed tomorrow.  Patient appeared to comprehend the reasons for the procedure as ascertained via the teach-back method.

## 2013-12-17 NOTE — ED Notes (Signed)
Patient transported to CT 

## 2013-12-17 NOTE — Procedures (Signed)
ELECTROENCEPHALOGRAM REPORT   Patient: Jack Suarez       Room #: 2C13 EEG No. ID: [id number] Age: 23 y.o.        Sex: male Referring Physician: Jomarie Longs Report Date:  12/17/2013        Interpreting Physician: Thana Farr D  History: Jack Suarez is an 23 y.o. male with a history of seizure presenting with breakthrough seizures.   Medications:  Scheduled: . acyclovir  10 mg/kg Intravenous 3 times per day  . cefTRIAXone (ROCEPHIN)  IV  2 g Intravenous Q12H  . ketamine  1.5 mg/kg Intravenous Once  . levETIRAcetam  1,500 mg Intravenous Q12H  . [START ON 12/18/2013] pneumococcal 23 valent vaccine  0.5 mL Intramuscular Tomorrow-1000  . propofol  0.5 mg/kg Intravenous Once  . valproate sodium  500 mg Intravenous Q8H  . vancomycin  1,000 mg Intravenous Q8H    Conditions of Recording:  This is a 16 channel EEG carried out with the patient in the drowsy and asleep states.  Description:  The background activity is discontinuous. It consists of bursts of polyspike and slow wave activity alternating with periods of attenuation. The burst activity lasts up to 5 seconds. The periods of attenuation last from 10 to 30 seconds. This discontinuous activity is maintained throughout the tracing until the patient is alerted.   With alerting the background activity is continuous and consists of moderate to low voltage delta and theta activity.  This is only achieved briefly and no full wakefulness is achieved.   Hyperventilation and intermittent photic stimulation were not performed  IMPRESSION: This is an abnormal electroencephalogram secondary to burst suppression activity consistent with the patient being post-ictal.  Also noted were occasional right frontal sharp transients consistent with the patient's history of seizures.     Thana Farr, MD [facility] [phone] 12/17/2013, 6:33 PM

## 2013-12-17 NOTE — Progress Notes (Signed)
Bedside EEG completed, results pending. 

## 2013-12-17 NOTE — Progress Notes (Signed)
Patient currently back to his baseline.  He is moving all extremities and shows no deficit. He did not get his LP today due to not remaining still. Will continue to follow patient while in house.   Felicie Morn PA-C [facility] [phone]  12/17/2013, 5:44 PM

## 2013-12-17 NOTE — Progress Notes (Signed)
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

## 2013-12-18 ENCOUNTER — Inpatient Hospital Stay (HOSPITAL_COMMUNITY): Payer: BC Managed Care – PPO

## 2013-12-18 LAB — GLUCOSE, CAPILLARY
Glucose-Capillary: 118 mg/dL — ABNORMAL HIGH (ref 70–99)
Glucose-Capillary: 124 mg/dL — ABNORMAL HIGH (ref 70–99)
Glucose-Capillary: 175 mg/dL — ABNORMAL HIGH (ref 70–99)
Glucose-Capillary: 89 mg/dL (ref 70–99)
Glucose-Capillary: 94 mg/dL (ref 70–99)

## 2013-12-18 LAB — GLUCOSE, CSF: Glucose, CSF: 63 mg/dL (ref 43–76)

## 2013-12-18 LAB — CSF CELL COUNT WITH DIFFERENTIAL
RBC COUNT CSF: 430 /mm3 — AB
RBC Count, CSF: 1460 /mm3 — ABNORMAL HIGH
TUBE #: 1
TUBE #: 4
WBC, CSF: 2 /mm3 (ref 0–5)
WBC, CSF: 2 /mm3 (ref 0–5)

## 2013-12-18 LAB — CBC
HCT: 46 % (ref 39.0–52.0)
Hemoglobin: 15.4 g/dL (ref 13.0–17.0)
MCH: 29.5 pg (ref 26.0–34.0)
MCHC: 33.5 g/dL (ref 30.0–36.0)
MCV: 88.1 fL (ref 78.0–100.0)
PLATELETS: 134 10*3/uL — AB (ref 150–400)
RBC: 5.22 MIL/uL (ref 4.22–5.81)
RDW: 13.7 % (ref 11.5–15.5)
WBC: 9.4 10*3/uL (ref 4.0–10.5)

## 2013-12-18 LAB — BASIC METABOLIC PANEL
ANION GAP: 14 (ref 5–15)
BUN: 6 mg/dL (ref 6–23)
CHLORIDE: 104 meq/L (ref 96–112)
CO2: 21 mEq/L (ref 19–32)
Calcium: 8.9 mg/dL (ref 8.4–10.5)
Creatinine, Ser: 1.08 mg/dL (ref 0.50–1.35)
GFR calc non Af Amer: >90 mL/min (ref >90)
Glucose, Bld: 90 mg/dL (ref 70–99)
Potassium: 3.6 mEq/L — ABNORMAL LOW (ref 3.7–5.3)
SODIUM: 139 meq/L (ref 137–147)

## 2013-12-18 LAB — PROTEIN, CSF: Total  Protein, CSF: 20 mg/dL (ref 15–45)

## 2013-12-18 LAB — GRAM STAIN

## 2013-12-18 MED ORDER — DIVALPROEX SODIUM 500 MG PO DR TAB
500.0000 mg | DELAYED_RELEASE_TABLET | Freq: Two times a day (BID) | ORAL | Status: DC
  Administered 2013-12-18 – 2013-12-19 (×2): 500 mg via ORAL
  Filled 2013-12-18 (×4): qty 1

## 2013-12-18 MED ORDER — LEVETIRACETAM 750 MG PO TABS
1500.0000 mg | ORAL_TABLET | Freq: Two times a day (BID) | ORAL | Status: DC
  Administered 2013-12-18 – 2013-12-19 (×2): 1500 mg via ORAL
  Filled 2013-12-18 (×4): qty 2

## 2013-12-18 MED ORDER — PNEUMOCOCCAL VAC POLYVALENT 25 MCG/0.5ML IJ INJ
0.5000 mL | INJECTION | INTRAMUSCULAR | Status: AC
  Administered 2013-12-19: 0.5 mL via INTRAMUSCULAR
  Filled 2013-12-18: qty 0.5

## 2013-12-18 NOTE — Progress Notes (Signed)
[facility] PROGRESS NOTE  Larell Baney PPI:[id number] DOB: [birth year] DOA: 12/16/2013 PCP: No primary provider on file.  Assessment/Plan: 1. Fever/AMS -back to baseline -on empiric Vanc/Ceftriaxone and Acyclovir -Agreeable to re-attempt LP today and agreeable to stay still and cooperate to complete procedure. -LPs attempted in ER and in Fluoro yesterday but he wouldn't stay still  2. Breakthrough seizures -MRI with possibly chronic changes -Now stable on Keppra and Depakote  DVt proph: on hold for LP  Code Status: Full Code Family Communication: girlfriend at bedside Disposition Plan: [place] to floor   Consultants:  Neuro  IR  Procedures:  LP attempted  HPI/Subjective: Feels ok, back to baseline  Objective: Filed Vitals:   12/18/13 0400  BP: 102/52  Pulse:   Temp: 98.6 F (37 C)  Resp: 14    Intake/Output Summary (Last 24 hours) at 12/18/13 0720 Last data filed at 12/18/13 0430  Gross per 24 hour  Intake   1570 ml  Output    825 ml  Net    745 ml   Filed Weights   12/17/13 0314 12/17/13 0630  Weight: 74.844 kg (165 lb) 80.3 kg (177 lb 0.5 oz)    Exam:   General:  AAOx3, no distress  Cardiovascular: S1S2/RRR  Respiratory: CTAB  Abdomen: soft, Nt, BS present  Musculoskeletal:no edema c/c  Neuro: non focal  Data Reviewed: Basic Metabolic Panel:  Recent Labs Lab 12/16/13 1025 12/17/13 0027 12/17/13 1006 12/18/13 0457  NA 138 139 139 139  K 5.5* 4.6 3.7 3.6*  CL 100 100 104 104  CO2 17* GLUCOSE 110* 90 90 90  BUN CREATININE 1.11 1.18 1.12 1.08  CALCIUM 9.5 9.4 8.6 8.9   Liver Function Tests:  Recent Labs Lab 12/17/13 1006  AST 66*  ALT 16  ALKPHOS 46  BILITOT 0.7  PROT 6.7  ALBUMIN 3.6   No results found for this basename: LIPASE, AMYLASE,  in the last 168 hours No results found for this basename: AMMONIA,  in the last 168 hours CBC:  Recent Labs Lab 12/16/13 1025 12/17/13 0027  12/17/13 1006 12/18/13 0457  WBC 15.1* 16.0* 15.1* 9.4  NEUTROABS 12.3* 12.8* 10.3*  --   HGB 16.4 17.0 15.4 15.4  HCT 49.5 50.0 44.3 46.0  MCV 86.1 87.4 86.0 88.1  PLT 160 147* 140* 134*   Cardiac Enzymes: No results found for this basename: CKTOTAL, CKMB, CKMBINDEX, TROPONINI,  in the last 168 hours BNP (last 3 results) No results found for this basename: PROBNP,  in the last 8760 hours CBG:  Recent Labs Lab 12/17/13 1219 12/17/13 1727 12/17/13 1952 12/18/13 0054 12/18/13 0423  GLUCAP 76 73 86 175* 89    Recent Results (from the past 240 hour(s))  MRSA PCR SCREENING     Status: None   Collection Time    12/17/13  7:13 AM      Result Value Ref Range Status   MRSA by PCR NEGATIVE  NEGATIVE Final   Comment:            The GeneXpert MRSA Assay (FDA     approved for NASAL specimens     only), is one component of a     comprehensive MRSA colonization     surveillance program. It is not     intended to diagnose MRSA     infection nor to guide or     monitor treatment for     MRSA infections.  Studies: Ct Head Wo Contrast  12/17/2013   CLINICAL DATA:  Multiple seizures.  EXAM: CT HEAD WITHOUT CONTRAST  TECHNIQUE: Contiguous axial images were obtained from the base of the skull through the vertex without intravenous contrast.  COMPARISON:  CT head October 04, 2013, CT head January 04, 2010  FINDINGS: The ventricles and sulci are normal. No intraparenchymal hemorrhage, mass effect nor midline shift. Suspected cortical thinning of the right parietal lobe, for example axial 21/33. No acute large vascular territory infarcts.  No abnormal extra-axial fluid collections. Basal cisterns are patent.  No skull fracture. The included ocular globes and orbital contents are non-suspicious. The mastoid aircells and included paranasal sinuses are well-aerated.  IMPRESSION: Motion degraded examination, no acute intracranial process.  Suspected cortical thinning of the right parietal lobe,  this may reflect encephalomalacia/remote traumatic etiology. Findings would be better characterized on MRI of the brain on a nonemergent basis.   Electronically Signed   By: Awilda Metro   On: 12/17/2013 03:24   Mr Brain Wo Contrast  12/17/2013   CLINICAL DATA:  Seizures.  EXAM: MRI HEAD WITHOUT CONTRAST  TECHNIQUE: Multiplanar, multiecho pulse sequences of the brain and surrounding structures were obtained without intravenous contrast.  COMPARISON:  Head CT 12/17/2013, 10/04/2013, 01/04/2010.  FINDINGS: The study suffers from motion degradation despite the technologist's best effort. The technologists described a lack of patient cooperation.  Study confirms an abnormal appearance of the right parietal region with asymmetric prominence of the sulci. There is not any evidence of gliosis or hemorrhage in that region. One could question if the gyri are too numerous in this region.  No mass lesion, hydrocephalus or extra-axial collection. No pituitary mass. No inflammatory sinus disease.  IMPRESSION: Abnormal right parietal lobe. 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 In either case, this could relate to the history of seizures.   Electronically Signed   By: Paulina Fusi M.D.   On: 12/17/2013 16:42    Scheduled Meds: . acyclovir  10 mg/kg Intravenous 3 times per day  . cefTRIAXone (ROCEPHIN)  IV  2 g Intravenous Q12H  . ketamine  1.5 mg/kg Intravenous Once  . levETIRAcetam  1,500 mg Intravenous Q12H  . pneumococcal 23 valent vaccine  0.5 mL Intramuscular Tomorrow-1000  . propofol  0.5 mg/kg Intravenous Once  . valproate sodium  500 mg Intravenous Q8H  . vancomycin  1,000 mg Intravenous Q8H   Continuous Infusions: . sodium chloride Stopped (12/17/13 1500)   Antibiotics Given (last 72 hours)   Date/Time Action Medication Dose Rate   12/17/13 0545 Given   acyclovir (ZOVIRAX) 750 mg in dextrose 5 % 150 mL IVPB  750 mg 165 mL/hr   12/17/13 0758 Given   vancomycin (VANCOCIN) IVPB 1000 mg/200 mL premix 1,000 mg 200 mL/hr   12/17/13 1700 Given   acyclovir (ZOVIRAX) 750 mg in dextrose 5 % 150 mL IVPB 750 mg 165 mL/hr   12/18/13 0020 Given  [Pt had no IV access]   cefTRIAXone (ROCEPHIN) 2 g in dextrose 5 % 50 mL IVPB 2 g 100 mL/hr   12/18/13 0054 Given  [No IV access at the time]   acyclovir (ZOVIRAX) 750 mg in dextrose 5 % 150 mL IVPB 750 mg 165 mL/hr   12/18/13 0216 Given  [Medication behind due to no IV access]   vancomycin (VANCOCIN) IVPB 1000 mg/200 mL premix 1,000 mg 200 mL/hr      Principal Problem:   Seizures  Active Problems:   Leucocytosis   Encephalopathy acute    Time spent:    [facility]  [facility] Pager [phone]. If 7PM-7AM, please contact night-coverage at [email or web address], password [facility] 12/18/2013, 7:20 AM  LOS: 2 days

## 2013-12-18 NOTE — Progress Notes (Signed)
Subjective: Patient at baseline.  Awake and alert.  No further seizure activity noted.    Objective: Current vital signs: BP 118/63  Pulse 64  Temp(Src) 97.9 F (36.6 C) (Oral)  Resp 18  Ht  (1.854 m)  Wt 80.3 kg (177 lb 0.5 oz)  BMI 23.36 kg/m2  SpO2 99% Vital signs in last 24 hours: Temp:  [97.9 F (36.6 C)-98.8 F (37.1 C)] 97.9 F (36.6 C) (08/27 0826) Pulse Rate:  [64-83] 64 (08/27 0826) Resp:  [14-19] 18 (08/27 0900) BP: (98-125)/(48-68) 118/63 mmHg (08/27 0900) SpO2:  [97 %-99 %] 99 % (08/27 0826)  Intake/Output from previous day: 08/26 0701 - 08/27 0700 In: 1570 [I.V.:525; IV Piggyback:1045] Out: 825 [Urine:825] Intake/Output this shift: Total I/O In: 240 [I.V.:75; IV Piggyback:165] Out: -  Nutritional status: General  Neurologic Exam: General: In bed, NAD  CV: Regular rate and rhythm  Mental Status:  Awake and alert.  Follows commands without difficulty.  Speech fluent without evidence of aphasia. Cranial Nerves:  II: Visual Fields are full. Pupils are equal, round, and reactive to light. Discs are difficult to visualize.  III,IV, VI: EOMI without ptosis or diploplia.  V: Facial sensation is symmetric to temperature  VII: Facial movement is symmetric.  VIII: hearing is intact to voice  X: Uvula elevates symmetrically  XI: Shoulder shrug is symmetric.  XII: tongue is midline without atrophy or fasciculations.  Motor:  Tone is normal. Bulk is normal. 5/5 strength was present in all four extremities.  Sensory:  Sensation is symmetric to light touch and temperature in the arms and legs.  Deep Tendon Reflexes:  2+ and symmetric in the biceps and patellae.     Lab Results: Basic Metabolic Panel:  Recent Labs Lab 12/16/13 1025 12/17/13 0027 12/17/13 1006 12/18/13 0457  NA 138 139 139 139  K 5.5* 4.6 3.7 3.6*  CL 100 100 104 104  CO2 17* GLUCOSE 110* 90 90 90  BUN CREATININE 1.11 1.18 1.12 1.08  CALCIUM 9.5 9.4 8.6 8.9     Liver Function Tests:  Recent Labs Lab 12/17/13 1006  AST 66*  ALT 16  ALKPHOS 46  BILITOT 0.7  PROT 6.7  ALBUMIN 3.6   No results found for this basename: LIPASE, AMYLASE,  in the last 168 hours No results found for this basename: AMMONIA,  in the last 168 hours  CBC:  Recent Labs Lab 12/16/13 1025 12/17/13 0027 12/17/13 1006 12/18/13 0457  WBC 15.1* 16.0* 15.1* 9.4  NEUTROABS 12.3* 12.8* 10.3*  --   HGB 16.4 17.0 15.4 15.4  HCT 49.5 50.0 44.3 46.0  MCV 86.1 87.4 86.0 88.1  PLT 160 147* 140* 134*    Cardiac Enzymes: No results found for this basename: CKTOTAL, CKMB, CKMBINDEX, TROPONINI,  in the last 168 hours  Lipid Panel: No results found for this basename: CHOL, TRIG, HDL, CHOLHDL, VLDL, LDLCALC,  in the last 168 hours  CBG:  Recent Labs Lab 12/17/13 1952 12/18/13 0054 12/18/13 0423 12/18/13 0829 12/18/13 1206  GLUCAP 86 175* 89 94 118*    Microbiology: Results for orders placed during the hospital encounter of 12/16/13  MRSA PCR SCREENING     Status: None   Collection Time    12/17/13  7:13 AM      Result Value Ref Range Status   MRSA by PCR NEGATIVE  NEGATIVE Final   Comment:  The GeneXpert MRSA Assay (FDA     approved for NASAL specimens     only), is one component of a     comprehensive MRSA colonization     surveillance program. It is not     intended to diagnose MRSA     infection nor to guide or     monitor treatment for     MRSA infections.  GRAM STAIN     Status: None   Collection Time    12/18/13  9:12 AM      Result Value Ref Range Status   Specimen Description CSF   Final   Special Requests 3.0ML FLUID   Final   Gram Stain     Final   Value: WBC PRESENT, PREDOMINANTLY MONONUCLEAR     NO ORGANISMS SEEN     CYTOSPIN   Report Status 12/18/2013 FINAL   Final    Coagulation Studies: No results found for this basename: LABPROT, INR,  in the last 72 hours  Imaging: Ct Head Wo Contrast  12/17/2013   CLINICAL  DATA:  Multiple seizures.  EXAM: CT HEAD WITHOUT CONTRAST  TECHNIQUE: Contiguous axial images were obtained from the base of the skull through the vertex without intravenous contrast.  COMPARISON:  CT head October 04, 2013, CT head January 04, 2010  FINDINGS: The ventricles and sulci are normal. No intraparenchymal hemorrhage, mass effect nor midline shift. Suspected cortical thinning of the right parietal lobe, for example axial 21/33. No acute large vascular territory infarcts.  No abnormal extra-axial fluid collections. Basal cisterns are patent.  No skull fracture. The included ocular globes and orbital contents are non-suspicious. The mastoid aircells and included paranasal sinuses are well-aerated.  IMPRESSION: Motion degraded examination, no acute intracranial process.  Suspected cortical thinning of the right parietal lobe, this may reflect encephalomalacia/remote traumatic etiology. Findings would be better characterized on MRI of the brain on a nonemergent basis.   Electronically Signed   By: Awilda Metro   On: 12/17/2013 03:24   Mr Brain Wo Contrast  12/17/2013   CLINICAL DATA:  Seizures.  EXAM: MRI HEAD WITHOUT CONTRAST  TECHNIQUE: Multiplanar, multiecho pulse sequences of the brain and surrounding structures were obtained without intravenous contrast.  COMPARISON:  Head CT 12/17/2013, 10/04/2013, 01/04/2010.  FINDINGS: The study suffers from motion degradation despite the technologist's best effort. The technologists described a lack of patient cooperation.  Study confirms an abnormal appearance of the right parietal region with asymmetric prominence of the sulci. There is not any evidence of gliosis or hemorrhage in that region. One could question if the gyri are too numerous in this region.  No mass lesion, hydrocephalus or extra-axial collection. No pituitary mass. No inflammatory sinus disease.  IMPRESSION: Abnormal right parietal lobe. 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 In either case, this could relate to the history of seizures.   Electronically Signed   By: Paulina Fusi M.D.   On: 12/17/2013 16:42   Dg Fluoro Guide Lumbar Puncture  12/18/2013   CLINICAL DATA:  Seizure disorder with seizures, low grade fever and leukocytosis.  EXAM: DIAGNOSTIC LUMBAR PUNCTURE UNDER FLUOROSCOPIC GUIDANCE  FLUOROSCOPY TIME:  9 seconds.  PROCEDURE: Time out procedure was performed. Informed consent was obtained from the patient prior to the procedure, including potential complications of headache, allergy, and pain. With the patient prone, the lower back was prepped with Betadine. 1% Lidocaine was used for local anesthesia. Lumbar puncture was  performed at the left at the L2-3 level using a 20 gauge needle with return of clear CSF. There is limited return from the needle with the table prone consistent with an opening pressure of approximately 9 cm. The head of the table was elevated and fluid was collected without complication. 11.5 ml of CSF were obtained for laboratory studies. The patient tolerated the procedure well and there were no apparent complications.  IMPRESSION: Diagnostic lumbar puncture performed without immediate complications.   Electronically Signed   By: Roxy Horseman M.D.   On: 12/18/2013 09:28    Medications:  I have reviewed the patient's current medications. Scheduled: . acyclovir  10 mg/kg Intravenous 3 times per day  . cefTRIAXone (ROCEPHIN)  IV  2 g Intravenous Q12H  . ketamine  1.5 mg/kg Intravenous Once  . levETIRAcetam  1,500 mg Intravenous Q12H  . [START ON 12/19/2013] pneumococcal 23 valent vaccine  0.5 mL Intramuscular Tomorrow-1000  . propofol  0.5 mg/kg Intravenous Once  . valproate sodium  500 mg Intravenous Q8H  . vancomycin  1,000 mg Intravenous Q8H    Assessment/Plan: No further seizures noted.  Patient at baseline.  Patient on Keppra and Depakote.  Tolerating well.  LP  performed and initial results do not suggest an infectious etiology with normal protein, glucose and only 2 wbc's. EEG consistent with findings on imaging and shows right frontal cortical irritability.    Recommendations: 1.  Depakote level in AM 2.  Change anticonvulsant therapy to po 3.  Continue seizure precautions    LOS: 2 days   Thana Farr, MD [facility] [phone] 12/18/2013  12:26 PM

## 2013-12-18 NOTE — Progress Notes (Signed)
Pt arrived to the unit via wheelchair.  Pt oriented to the unit and settled in.  Pt up walking around, refuses to sit down on bed or in chair.  States "I'm tired of laying in that bed".  Will continue to monitor. Sondra Come, RN 12/18/2013 6:09 PM

## 2013-12-18 NOTE — Procedures (Signed)
Lumbar puncture was performed at the left at the L2-3 level using a 20 gauge needle with return of 11.5 cc CSF.  See radiology report.

## 2013-12-18 NOTE — Progress Notes (Signed)
Patient transferred from [facility] rm 13 to 4N rm 30, after report given to receiving RN, Latoya.  All questions answered.  Patient's medications, chart, and personal belongings all sent with patient.  Girlfriend present during transfer.  Transferred via wheelchair on RA by nurse tech.  Safety measures maintained.  Keitha Butte, RN

## 2013-12-19 LAB — GLUCOSE, CAPILLARY
Glucose-Capillary: 83 mg/dL (ref 70–99)
Glucose-Capillary: 79 mg/dL (ref 70–99)
Glucose-Capillary: 80 mg/dL (ref 70–99)
Glucose-Capillary: 82 mg/dL (ref 70–99)

## 2013-12-19 LAB — HERPES SIMPLEX VIRUS(HSV) DNA BY PCR
HSV 1 DNA: NOT DETECTED
HSV 2 DNA: NOT DETECTED

## 2013-12-19 LAB — VALPROIC ACID LEVEL: VALPROIC ACID LVL: 56.8 ug/mL (ref 50.0–100.0)

## 2013-12-19 LAB — PATHOLOGIST SMEAR REVIEW

## 2013-12-19 MED ORDER — DIVALPROEX SODIUM 500 MG PO DR TAB: 500.0000 mg | DELAYED_RELEASE_TABLET | Freq: Two times a day (BID) | ORAL | Status: DC

## 2013-12-19 MED ORDER — UNABLE TO FIND: Status: DC

## 2013-12-19 MED ORDER — LEVETIRACETAM 750 MG PO TABS: 1500.0000 mg | ORAL_TABLET | Freq: Two times a day (BID) | ORAL | Status: DC

## 2013-12-19 NOTE — Discharge Summary (Addendum)
Physician Discharge Summary  Jack Suarez UJW:[id number] DOB: 1990-10-24 DOA: 12/16/2013  PCP: No primary provider on file.  Admit date: 12/16/2013 Discharge date: 12/19/2013  Time spent:35 minutes  Recommendations for Outpatient Follow-up:  1. Dr.Mahon primary neurologist in 2 weeks 2. Advised about no driving and operating machinery till cleared by Neuro  Discharge Diagnoses:  Principal Problem:   Seizures Active Problems:   Leucocytosis   Encephalopathy acute   Discharge Condition: stable  Diet recommendation: regular  Filed Weights   12/17/13 0314 12/17/13 0630  Weight: 74.844 kg (165 lb) 80.3 kg (177 lb 0.5 oz)    History of present illness:  Jack Suarez is a 23 y.o. male with history of seizures was brought to the ER after patient had seizures at his house. Following which patient had 2 more episodes of seizures which as per patient's mother lasted for less than a minute each and was tonic-clonic. EMS was called and patient was brought to the ER. On-call neurologist Dr. Amada Jupiter was consulted by EDP and patient was given a loading dose of Keppra and advised to add Vimpat and lumbar puncture.As per patient's mother patient's neurologist had recently increased patient's Keppra to 3000 mg daily 2 weeks ago since patient was having increased frequency of seizures. Patient has history of meningitis when he was 19 months old.   Hospital Course:  1. Fever/AMS -he was admitted with multiple seizures, Encephalopathy and fever -improved back to baseline  -was treated with empiric Vanc/Ceftriaxone and Acyclovir, LP completed and negative for infection, hence Abx stopped  2. Breakthrough seizures  -MRI with possibly chronic changes, EEG c/w post ictal state -started on Depakote and changed dose of Keppra per Neurology, stable o this regimen, and hence discharged home on this. -Needs Fu with primary NEurologist in 2  weeks   Procedures:  EEG  MRI  Consultations:  Neuro  Discharge Exam: Filed Vitals:   12/19/13 1034  BP: 130/56  Pulse: 55  Temp: 98.5 F (36.9 C)  Resp: 20    General: AAOx3 Cardiovascular: S1S2/RRR Respiratory: CTAB  Discharge Instructions You were cared for by a hospitalist during your hospital stay. If you have any questions about your discharge medications or the care you received while you were in the hospital after you are discharged, you can call the unit and asked to speak with the hospitalist on call if the hospitalist that took care of you is not available. Once you are discharged, your primary care physician will handle any further medical issues. Please note that NO REFILLS for any discharge medications will be authorized once you are discharged, as it is imperative that you return to your primary care physician (or establish a relationship with a primary care physician if you do not have one) for your aftercare needs so that they can reassess your need for medications and monitor your lab values.  Discharge Instructions   Diet general    Complete by:  As directed      Discharge instructions    Complete by:  As directed   No driving, operating machinery or any underwater activities till cleared by NEurologist     Increase activity slowly    Complete by:  As directed             Medication List         divalproex 500 MG DR tablet  Commonly known as:  DEPAKOTE  Take 1 tablet (500 mg total) by mouth every 12 (twelve) hours.  levETIRAcetam 750 MG tablet  Commonly known as:  KEPPRA  Take 2 tablets (1,500 mg total) by mouth 2 (two) times daily.     UNABLE TO FIND  This note is to excuse [name] due to medical illness requiring hospitalization from 8/25-8/28       No Known Allergies     Follow-up Information   Follow up with Neurologist . Schedule an appointment as soon as possible for a visit in 2 weeks.       The results of significant  diagnostics from this hospitalization (including imaging, microbiology, ancillary and laboratory) are listed below for reference.    Significant Diagnostic Studies: Ct Head Wo Contrast  12/17/2013   CLINICAL DATA:  Multiple seizures.  EXAM: CT HEAD WITHOUT CONTRAST  TECHNIQUE: Contiguous axial images were obtained from the base of the skull through the vertex without intravenous contrast.  COMPARISON:  CT head October 04, 2013, CT head January 04, 2010  FINDINGS: The ventricles and sulci are normal. No intraparenchymal hemorrhage, mass effect nor midline shift. Suspected cortical thinning of the right parietal lobe, for example axial 21/33. No acute large vascular territory infarcts.  No abnormal extra-axial fluid collections. Basal cisterns are patent.  No skull fracture. The included ocular globes and orbital contents are non-suspicious. The mastoid aircells and included paranasal sinuses are well-aerated.  IMPRESSION: Motion degraded examination, no acute intracranial process.  Suspected cortical thinning of the right parietal lobe, this may reflect encephalomalacia/remote traumatic etiology. Findings would be better characterized on MRI of the brain on a nonemergent basis.   Electronically Signed   By: Awilda Metro   On: 12/17/2013 03:24   Mr Brain Wo Contrast  12/17/2013   CLINICAL DATA:  Seizures.  EXAM: MRI HEAD WITHOUT CONTRAST  TECHNIQUE: Multiplanar, multiecho pulse sequences of the brain and surrounding structures were obtained without intravenous contrast.  COMPARISON:  Head CT 12/17/2013, 10/04/2013, 01/04/2010.  FINDINGS: The study suffers from motion degradation despite the technologist's best effort. The technologists described a lack of patient cooperation.  Study confirms an abnormal appearance of the right parietal region with asymmetric prominence of the sulci. There is not any evidence of gliosis or hemorrhage in that region. One could question if the gyri are too numerous in this  region.  No mass lesion, hydrocephalus or extra-axial collection. No pituitary mass. No inflammatory sinus disease.  IMPRESSION: Abnormal right parietal lobe. 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 In either case, this could relate to the history of seizures.   Electronically Signed   By: Paulina Fusi M.D.   On: 12/17/2013 16:42   Dg Fluoro Guide Lumbar Puncture  12/18/2013   CLINICAL DATA:  Seizure disorder with seizures, low grade fever and leukocytosis.  EXAM: DIAGNOSTIC LUMBAR PUNCTURE UNDER FLUOROSCOPIC GUIDANCE  FLUOROSCOPY TIME:  9 seconds.  PROCEDURE: Time out procedure was performed. Informed consent was obtained from the patient prior to the procedure, including potential complications of headache, allergy, and pain. With the patient prone, the lower back was prepped with Betadine. 1% Lidocaine was used for local anesthesia. Lumbar puncture was performed at the left at the L2-3 level using a 20 gauge needle with return of clear CSF. There is limited return from the needle with the table prone consistent with an opening pressure of approximately 9 cm. The head of the table was elevated and fluid was collected without complication. 11.5 ml of CSF were obtained for laboratory studies. The patient tolerated  the procedure well and there were no apparent complications.  IMPRESSION: Diagnostic lumbar puncture performed without immediate complications.   Electronically Signed   By: Roxy Horseman M.D.   On: 12/18/2013 09:28    Microbiology: Recent Results (from the past 240 hour(s))  MRSA PCR SCREENING     Status: None   Collection Time    12/17/13  7:13 AM      Result Value Ref Range Status   MRSA by PCR NEGATIVE  NEGATIVE Final   Comment:            The GeneXpert MRSA Assay (FDA     approved for NASAL specimens     only), is one component of a     comprehensive MRSA colonization     surveillance program. It is  not     intended to diagnose MRSA     infection nor to guide or     monitor treatment for     MRSA infections.  CSF CULTURE     Status: None   Collection Time    12/18/13  9:12 AM      Result Value Ref Range Status   Specimen Description CSF   Final   Special Requests 3.0ML FLUID   Final   Gram Stain     Final   Value: CYTOSPIN WBC PRESENT, PREDOMINANTLY MONONUCLEAR     NO ORGANISMS SEEN     Performed at [facility]     Performed at [facility]   Culture     Final   Value: NO GROWTH 1 DAY     Performed at [facility]   Report Status PENDING   Incomplete  GRAM STAIN     Status: None   Collection Time    12/18/13  9:12 AM      Result Value Ref Range Status   Specimen Description CSF   Final   Special Requests 3.0ML FLUID   Final   Gram Stain     Final   Value: WBC PRESENT, PREDOMINANTLY MONONUCLEAR     NO ORGANISMS SEEN     CYTOSPIN   Report Status 12/18/2013 FINAL   Final     Labs: Basic Metabolic Panel:  Recent Labs Lab 12/16/13 1025 12/17/13 0027 12/17/13 1006 12/18/13 0457  NA 138 139 139 139  K 5.5* 4.6 3.7 3.6*  CL 100 100 104 104  CO2 17* GLUCOSE 110* 90 90 90  BUN CREATININE 1.11 1.18 1.12 1.08  CALCIUM 9.5 9.4 8.6 8.9   Liver Function Tests:  Recent Labs Lab 12/17/13 1006  AST 66*  ALT 16  ALKPHOS 46  BILITOT 0.7  PROT 6.7  ALBUMIN 3.6   No results found for this basename: LIPASE, AMYLASE,  in the last 168 hours No results found for this basename: AMMONIA,  in the last 168 hours CBC:  Recent Labs Lab 12/16/13 1025 12/17/13 0027 12/17/13 1006 12/18/13 0457  WBC 15.1* 16.0* 15.1* 9.4  NEUTROABS 12.3* 12.8* 10.3*  --   HGB 16.4 17.0 15.4 15.4  HCT 49.5 50.0 44.3 46.0  MCV 86.1 87.4 86.0 88.1  PLT 160 147* 140* 134*   Cardiac Enzymes: No results found for this basename: CKTOTAL, CKMB, CKMBINDEX, TROPONINI,  in the last 168 hours BNP: BNP (last 3 results) No results found for this  basename: PROBNP,  in the last 8760 hours CBG:  Recent Labs Lab 12/18/13 1206 12/18/13 1609 12/19/13  0138 12/19/13 0427 12/19/13 0820  GLUCAP 118* 124* 83 79 80       Signed:  Shakima Nisley  [facility] 12/19/2013, 11:05 AM

## 2013-12-19 NOTE — Progress Notes (Signed)
Subjective: patient remains back to baseline.  No further seizures.  Walking in hallway on phone.   Objective: Current vital signs: BP 130/56  Pulse 55  Temp(Src) 98.5 F (36.9 C) (Oral)  Resp 20  Ht  (1.854 m)  Wt 80.3 kg (177 lb 0.5 oz)  BMI 23.36 kg/m2  SpO2 100% Vital signs in last 24 hours: Temp:  [98.3 F (36.8 C)-100.9 F (38.3 C)] 98.5 F (36.9 C) (08/28 1034) Pulse Rate:  [44-70] 55 (08/28 1034) Resp:  [13-20] 20 (08/28 1034) BP: (110-130)/(53-81) 130/56 mmHg (08/28 1034) SpO2:  [95 %-100 %] 100 % (08/28 1034)  Intake/Output from previous day: 08/27 0701 - 08/28 0700 In: 1300 [P.O.:240; I.V.:225; IV Piggyback:835] Out: 1100 [Urine:1100] Intake/Output this shift:   Nutritional status: General  Neurologic Exam: General: In bed, NAD  CV: Regular rate and rhythm  Mental Status:  Awake and alert. Follows commands without difficulty. Speech fluent without evidence of aphasia.  Cranial Nerves:  II: Visual Fields are full. Pupils are equal, round, and reactive to light. Discs are difficult to visualize.  III,IV, VI: EOMI without ptosis or diploplia.  V: Facial sensation is symmetric to temperature  VII: Facial movement is symmetric.  VIII: hearing is intact to voice  X: Uvula elevates symmetrically  XI: Shoulder shrug is symmetric.  XII: tongue is midline without atrophy or fasciculations.  Motor:  Tone is normal. Bulk is normal. 5/5 strength was present in all four extremities.  Sensory:  Sensation is symmetric to light touch and temperature in the arms and legs.  Deep Tendon Reflexes:  2+ and symmetric in the biceps and patellae   Lab Results: Basic Metabolic Panel:  Recent Labs Lab 12/16/13 1025 12/17/13 0027 12/17/13 1006 12/18/13 0457  NA 138 139 139 139  K 5.5* 4.6 3.7 3.6*  CL 100 100 104 104  CO2 17* GLUCOSE 110* 90 90 90  BUN CREATININE 1.11 1.18 1.12 1.08  CALCIUM 9.5 9.4 8.6 8.9    Liver Function  Tests:  Recent Labs Lab 12/17/13 1006  AST 66*  ALT 16  ALKPHOS 46  BILITOT 0.7  PROT 6.7  ALBUMIN 3.6   No results found for this basename: LIPASE, AMYLASE,  in the last 168 hours No results found for this basename: AMMONIA,  in the last 168 hours  CBC:  Recent Labs Lab 12/16/13 1025 12/17/13 0027 12/17/13 1006 12/18/13 0457  WBC 15.1* 16.0* 15.1* 9.4  NEUTROABS 12.3* 12.8* 10.3*  --   HGB 16.4 17.0 15.4 15.4  HCT 49.5 50.0 44.3 46.0  MCV 86.1 87.4 86.0 88.1  PLT 160 147* 140* 134*    Cardiac Enzymes: No results found for this basename: CKTOTAL, CKMB, CKMBINDEX, TROPONINI,  in the last 168 hours  Lipid Panel: No results found for this basename: CHOL, TRIG, HDL, CHOLHDL, VLDL, LDLCALC,  in the last 168 hours  CBG:  Recent Labs Lab 12/18/13 1206 12/18/13 1609 12/19/13 0138 12/19/13 0427 12/19/13 0820  GLUCAP 118* 124* 83 79 80    Microbiology: Results for orders placed during the hospital encounter of 12/16/13  MRSA PCR SCREENING     Status: None   Collection Time    12/17/13  7:13 AM      Result Value Ref Range Status   MRSA by PCR NEGATIVE  NEGATIVE Final   Comment:            The GeneXpert MRSA Assay (FDA  approved for NASAL specimens     only), is one component of a     comprehensive MRSA colonization     surveillance program. It is not     intended to diagnose MRSA     infection nor to guide or     monitor treatment for     MRSA infections.  CSF CULTURE     Status: None   Collection Time    12/18/13  9:12 AM      Result Value Ref Range Status   Specimen Description CSF   Final   Special Requests 3.0ML FLUID   Final   Gram Stain     Final   Value: CYTOSPIN WBC PRESENT, PREDOMINANTLY MONONUCLEAR     NO ORGANISMS SEEN     Performed at [facility]     Performed at [facility]   Culture     Final   Value: NO GROWTH 1 DAY     Performed at [facility]   Report Status PENDING   Incomplete  GRAM STAIN     Status:  None   Collection Time    12/18/13  9:12 AM      Result Value Ref Range Status   Specimen Description CSF   Final   Special Requests 3.0ML FLUID   Final   Gram Stain     Final   Value: WBC PRESENT, PREDOMINANTLY MONONUCLEAR     NO ORGANISMS SEEN     CYTOSPIN   Report Status 12/18/2013 FINAL   Final    Coagulation Studies: No results found for this basename: LABPROT, INR,  in the last 72 hours  Imaging: Mr Brain Wo Contrast  12/17/2013   CLINICAL DATA:  Seizures.  EXAM: MRI HEAD WITHOUT CONTRAST  TECHNIQUE: Multiplanar, multiecho pulse sequences of the brain and surrounding structures were obtained without intravenous contrast.  COMPARISON:  Head CT 12/17/2013, 10/04/2013, 01/04/2010.  FINDINGS: The study suffers from motion degradation despite the technologist's best effort. The technologists described a lack of patient cooperation.  Study confirms an abnormal appearance of the right parietal region with asymmetric prominence of the sulci. There is not any evidence of gliosis or hemorrhage in that region. One could question if the gyri are too numerous in this region.  No mass lesion, hydrocephalus or extra-axial collection. No pituitary mass. No inflammatory sinus disease.  IMPRESSION: Abnormal right parietal lobe. 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 In either case, this could relate to the history of seizures.   Electronically Signed   By: Paulina Fusi M.D.   On: 12/17/2013 16:42   Dg Fluoro Guide Lumbar Puncture  12/18/2013   CLINICAL DATA:  Seizure disorder with seizures, low grade fever and leukocytosis.  EXAM: DIAGNOSTIC LUMBAR PUNCTURE UNDER FLUOROSCOPIC GUIDANCE  FLUOROSCOPY TIME:  9 seconds.  PROCEDURE: Time out procedure was performed. Informed consent was obtained from the patient prior to the procedure, including potential complications of headache, allergy, and pain. With the patient prone, the  lower back was prepped with Betadine. 1% Lidocaine was used for local anesthesia. Lumbar puncture was performed at the left at the L2-3 level using a 20 gauge needle with return of clear CSF. There is limited return from the needle with the table prone consistent with an opening pressure of approximately 9 cm. The head of the table was elevated and fluid was collected without complication. 11.5 ml of CSF were obtained for laboratory studies.  The patient tolerated the procedure well and there were no apparent complications.  IMPRESSION: Diagnostic lumbar puncture performed without immediate complications.   Electronically Signed   By: Roxy Horseman M.D.   On: 12/18/2013 09:28    Medications:  Scheduled: . divalproex  500 mg Oral Q12H  . levETIRAcetam  1,500 mg Oral BID  . pneumococcal 23 valent vaccine  0.5 mL Intramuscular Tomorrow-1000  . propofol  0.5 mg/kg Intravenous Once    Assessment/Plan:  No further seizures noted. Depakote is therapeutic. Patient at baseline. LP performed gram stain and culture negative. WBC 2, (-) lymphocytes with normal protein and glucose.  At this time would discontinue ABX and continue current AED regime.    Recommend: 1) Continue current AED regime 2) Follow up with out patient neurology.      Felicie Morn PA-C [facility] [phone]  12/19/2013, 11:08 AM

## 2013-12-19 NOTE — Progress Notes (Signed)
Discharge orders received.  Discharge instructions and follow-up appointments reviewed with the patient.  VSS upon discharge.  Transported out via wheelchair. Sondra Come, RN 12/19/2013 11:50 AM

## 2013-12-21 LAB — CSF CULTURE W GRAM STAIN

## 2013-12-21 LAB — CSF CULTURE: CULTURE: NO GROWTH

## 2014-01-13 ENCOUNTER — Encounter (HOSPITAL_COMMUNITY): Payer: Self-pay | Admitting: Emergency Medicine

## 2014-01-13 ENCOUNTER — Emergency Department (HOSPITAL_COMMUNITY)
Admission: EM | Admit: 2014-01-13 | Discharge: 2014-01-13 | Disposition: A | Discharge status: Home / Self Care | Payer: BC Managed Care – PPO | Attending: Emergency Medicine | Admitting: Emergency Medicine

## 2014-01-13 DIAGNOSIS — Z79899 Other long term (current) drug therapy: Secondary | ICD-10-CM | POA: Diagnosis not present

## 2014-01-13 DIAGNOSIS — G40909 Epilepsy, unspecified, not intractable, without status epilepticus: Secondary | ICD-10-CM | POA: Insufficient documentation

## 2014-01-13 DIAGNOSIS — F17200 Nicotine dependence, unspecified, uncomplicated: Secondary | ICD-10-CM | POA: Insufficient documentation

## 2014-01-13 DIAGNOSIS — R51 Headache: Secondary | ICD-10-CM | POA: Diagnosis not present

## 2014-01-13 DIAGNOSIS — R569 Unspecified convulsions: Secondary | ICD-10-CM | POA: Diagnosis present

## 2014-01-13 LAB — VALPROIC ACID LEVEL: Valproic Acid Lvl: <10 ug/mL — ABNORMAL LOW (ref 50.0–100.0)

## 2014-01-13 MED ORDER — VALPROATE SODIUM 500 MG/5ML IV SOLN: 500.0000 mg | Freq: Once | INTRAVENOUS | Status: DC

## 2014-01-13 MED ORDER — VALPROATE SODIUM 500 MG/5ML IV SOLN
500.0000 mg | Freq: Once | INTRAVENOUS | Status: AC
  Administered 2014-01-13: 500 mg via INTRAVENOUS
  Filled 2014-01-13: qty 5

## 2014-01-13 NOTE — ED Notes (Signed)
[facility] cell [phone] Home [phone]

## 2014-01-13 NOTE — Discharge Instructions (Signed)
Follow with your Neurologist.  Seizure, Adult A seizure is abnormal electrical activity in the brain. Seizures usually last from 30 seconds to 2 minutes. There are various types of seizures. Before a seizure, you may have a warning sensation (aura) that a seizure is about to occur. An aura may include the following symptoms:   Fear or anxiety.  Nausea.  Feeling like the room is spinning (vertigo).  Vision changes, such as seeing flashing lights or spots. Common symptoms during a seizure include:  A change in attention or behavior (altered mental status).  Convulsions with rhythmic jerking movements.  Drooling.  Rapid eye movements.  Grunting.  Loss of bladder and bowel control.  Bitter taste in the mouth.  Tongue biting. After a seizure, you may feel confused and sleepy. You may also have an injury resulting from convulsions during the seizure. HOME CARE INSTRUCTIONS   If you are given medicines, take them exactly as prescribed by your health care provider.  Keep all follow-up appointments as directed by your health care provider.  Do not swim or drive or engage in risky activity during which a seizure could cause further injury to you or others until your health care provider says it is OK.  Get adequate rest.  Teach friends and family what to do if you have a seizure. They should:  Lay you on the ground to prevent a fall.  Put a cushion under your head.  Loosen any tight clothing around your neck.  Turn you on your side. If vomiting occurs, this helps keep your airway clear.  Stay with you until you recover.  Know whether or not you need emergency care. SEEK IMMEDIATE MEDICAL CARE IF:  The seizure lasts longer than 5 minutes.  The seizure is severe or you do not wake up immediately after the seizure.  You have an altered mental status after the seizure.  You are having more frequent or worsening seizures. Someone should drive you to the emergency  department or call local emergency services (911 in [place] MAKE SURE YOU:  Understand these instructions.  Will watch your condition.  Will get help right away if you are not doing well or get worse. Document Released: 04/07/2000 Document Revised: 01/29/2013 Document Reviewed: 11/20/2012 [facility] Patient Information 2015 [place], [place]. This information is not intended to replace advice given to you by your health care provider. Make sure you discuss any questions you have with your health care provider.

## 2014-01-13 NOTE — ED Notes (Signed)
Bed: ZO10 Expected date: 01/13/14 Expected time:  Means of arrival: Ambulance [EMS] Comments: SEIZURES

## 2014-01-13 NOTE — ED Notes (Signed)
Bed: UJ81 Expected date: 01/13/14 Expected time:  Means of arrival: Ambulance Comments: SEIZURES

## 2014-01-13 NOTE — ED Notes (Signed)
Pt is A&O x 4, not post ictal at the moment.

## 2014-01-13 NOTE — ED Provider Notes (Signed)
CSN: [id number]     Arrival date & time 01/13/14  1516 History   First MD Initiated Contact with Patient 01/13/14 1518     Chief Complaint  Patient presents with  . Seizures     (Consider location/radiation/quality/duration/timing/severity/associated sxs/prior Treatment) Patient is a 23 y.o. male presenting with seizures. The history is provided by the patient.  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 History of same. On Keppra and Depakote. Witnessed seizures. Recently admitted hospital for same, but had fever at that time. Negative workup. Continued same medications. No fevers. Patient states he has not been sleeping well. He has a dull headache. This confusion is improved. Her chest and abdominal pain. Patient states he did not hurt anything with a seizure.  Past Medical History  Diagnosis Date  . Seizures    History reviewed. No pertinent past surgical history. Family History  Problem Relation Age of Onset  . Seizures Father    History  Substance Use Topics  . Smoking status: Current Every Day Smoker    Types: Cigars  . Smokeless tobacco: Never Used  . Alcohol Use: Yes    Review of Systems  Constitutional: Negative for activity change and appetite change.  Eyes: Negative for pain.  Respiratory: Negative for chest tightness and shortness of breath.   Cardiovascular: Negative for chest pain and leg swelling.  Gastrointestinal: Negative for nausea, vomiting, abdominal pain and diarrhea.  Genitourinary: Negative for flank pain.  Musculoskeletal: Negative for back pain and neck stiffness.  Skin: Negative for rash.  Neurological: Positive for seizures and headaches. Negative for weakness and numbness.  Psychiatric/Behavioral: Negative for behavioral problems.      Allergies  Review of patient's allergies indicates no known allergies.  Home Medications   Prior to Admission medications    Medication Sig Start Date End Date Taking? Authorizing Provider  divalproex (DEPAKOTE) 500 MG DR tablet Take 1 tablet (500 mg total) by mouth every 12 (twelve) hours. 12/19/13  Yes Zannie Cove, MD  levETIRAcetam (KEPPRA) 750 MG tablet Take 2 tablets (1,500 mg total) by mouth 2 (two) times daily. 12/19/13  Yes Zannie Cove, MD   BP 113/64  Pulse 64  Temp(Src) 98.3 F (36.8 C) (Oral)  Resp 16  SpO2 98% Physical Exam  Nursing note and vitals reviewed. Constitutional: He is oriented to person, place, and time. He appears well-developed and well-nourished.  HENT:  Head: Normocephalic and atraumatic.  Eyes: EOM are normal. Pupils are equal, round, and reactive to light.  Neck: Normal range of motion. Neck supple.  Cardiovascular: Normal rate, regular rhythm and normal heart sounds.   No murmur heard. Pulmonary/Chest: Effort normal and breath sounds normal.  Abdominal: Soft. Bowel sounds are normal. He exhibits no distension and no mass. There is no tenderness. There is no rebound and no guarding.  Musculoskeletal: Normal range of motion. He exhibits no edema.  Neurological: He is alert and oriented to person, place, and time. No cranial nerve deficit.  Patient is awake and appropriate, but somewhat slow to answer.  Skin: Skin is warm and dry.  Psychiatric: He has a normal mood and affect.    ED Course  Procedures (including critical care time) Labs Review Labs Reviewed  VALPROIC ACID LEVEL - Abnormal; Notable for the following:    Valproic Acid Lvl <10.0 (*)    All other components within normal limits    Imaging Review No results found.   EKG  Interpretation None      MDM   Final diagnoses:  Seizures    Patient with seizure. History same. States he is on Depakote, the level is 0. Loaded with IV Depakote. Will continue to take his pills from home. Will discharge home to followup with neurology. Family has requested a new neurologist in [place].    Juliet Rude.  Rubin Payor, MD 01/13/14 2246

## 2014-01-13 NOTE — ED Notes (Addendum)
Per EMS: pt has two seizures, one unwitnessed and one witnessed by brother. Pt was slightly post-itcal on scene. Pt has 20 g in left hand. States pt does take meds. Vitals and CBG WNL

## 2015-11-27 IMAGING — CT CT HEAD W/O CM
2 of 4 series · 16 of 30 positions shown, 19 images · non-contrast
Comparison: CT head October 04, 2013, CT head January 04, 2010

CLINICAL DATA: Multiple seizures.

EXAM:
CT HEAD WITHOUT CONTRAST
TECHNIQUE: Contiguous axial images were obtained from the base of the skull
through the vertex without intravenous contrast.

[Series 2: head w/o · axial · non-contrast · 0.45mm/px · z∈[-299,-164]mm · 10 of 33 slices shown, 13 images]
[im 3/33  brain]
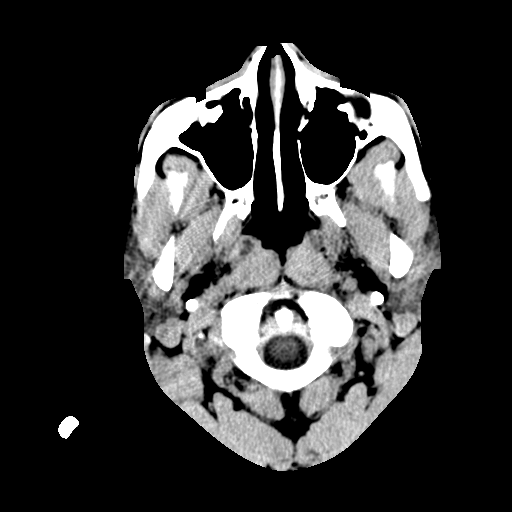
[im 3/33  bone]
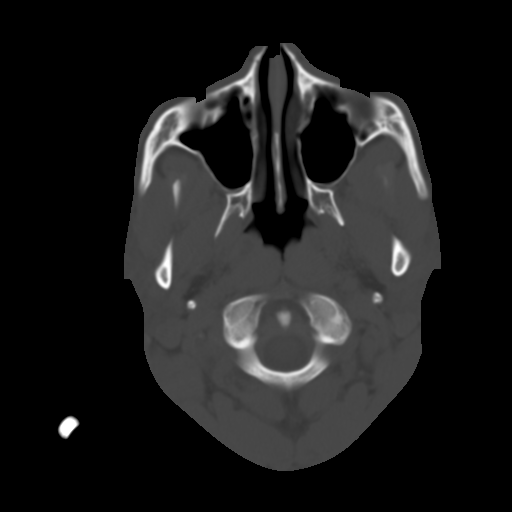
[im 6/33  brain]
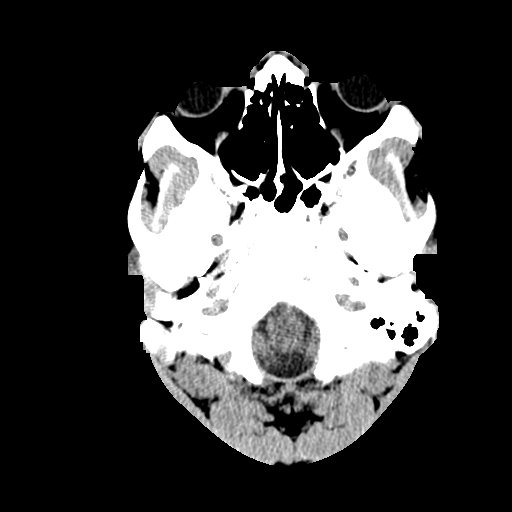
[im 9/33  brain]
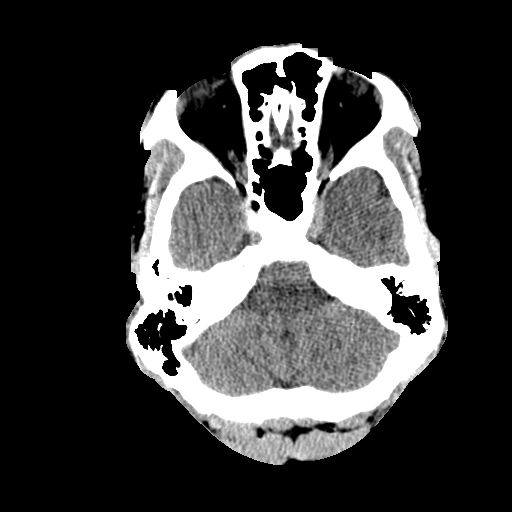
[im 12/33  brain]
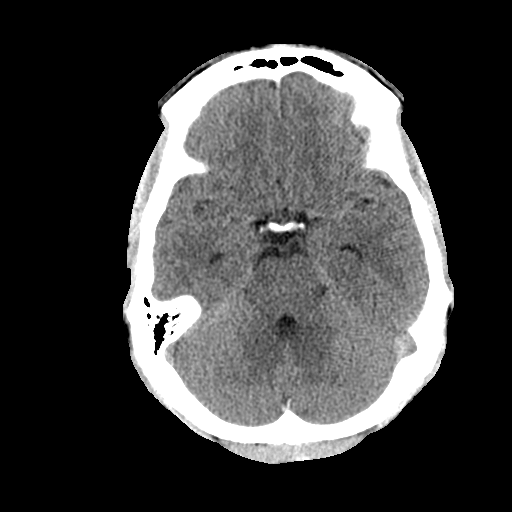
[im 15/33  brain]
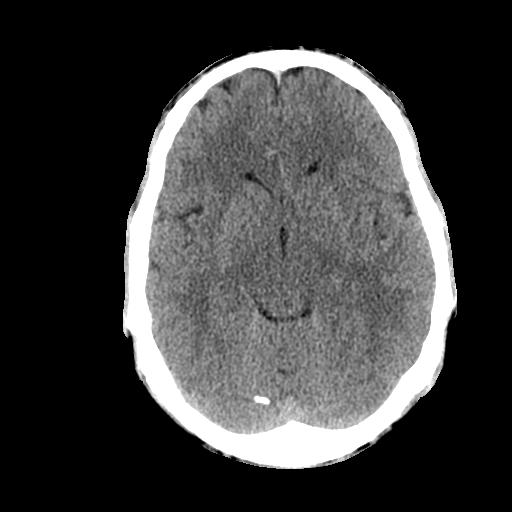
[im 15/33  bone]
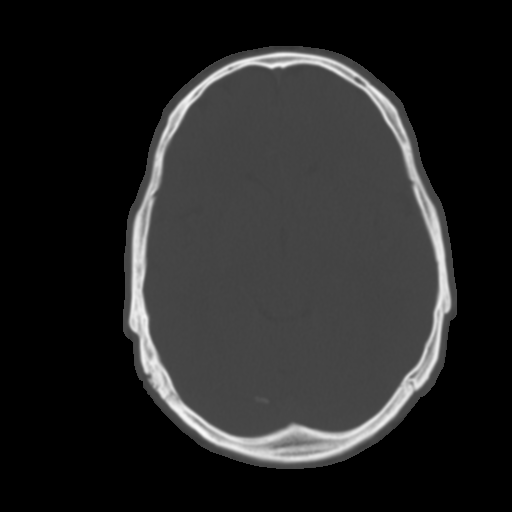
[im 18/33  brain]
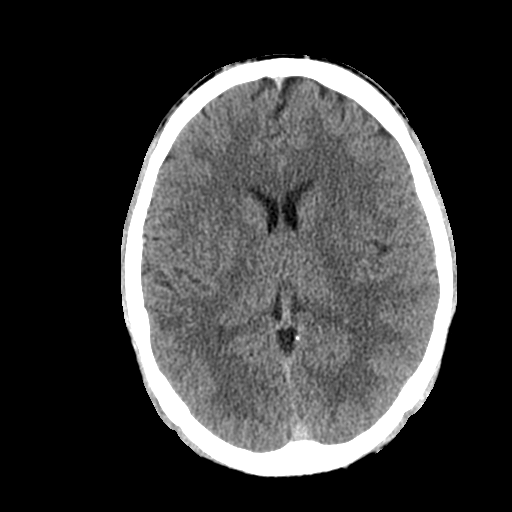
[im 21/33  brain]
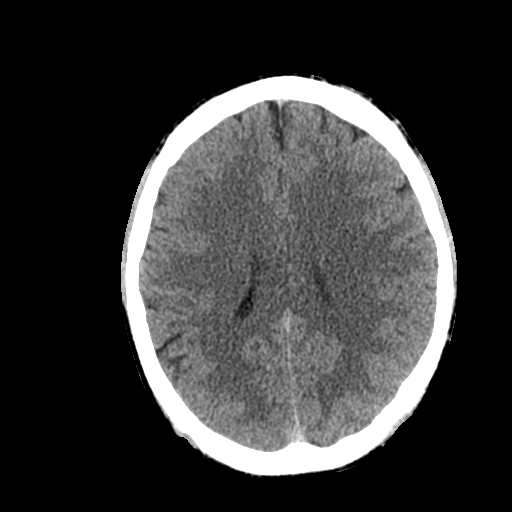
[im 24/33  brain]
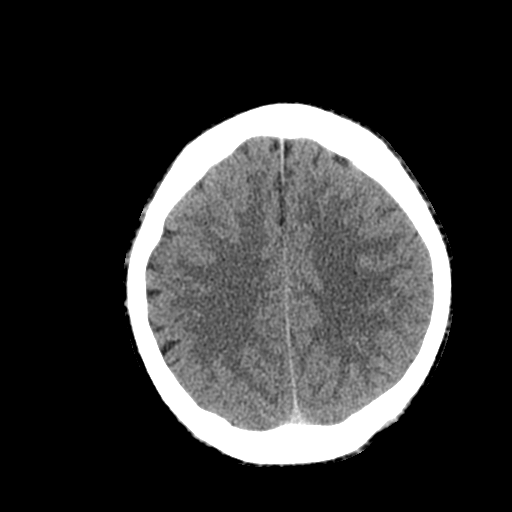
[im 27/33  brain]
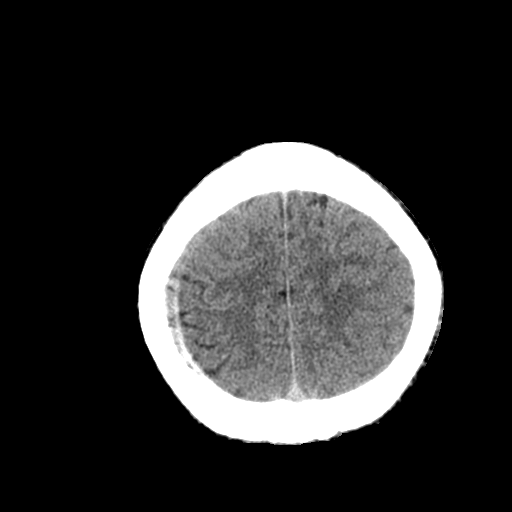
[im 27/33  bone]
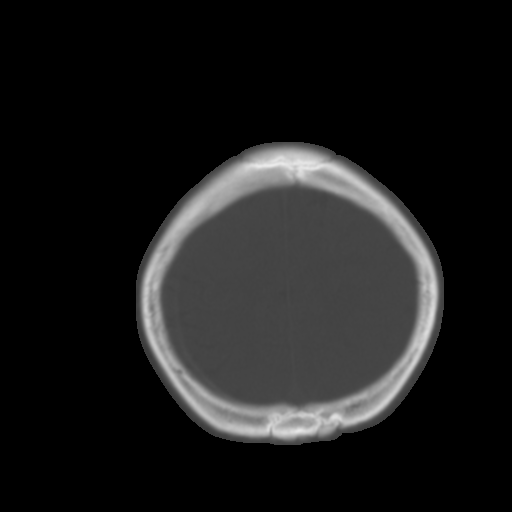
[im 30/33  brain]
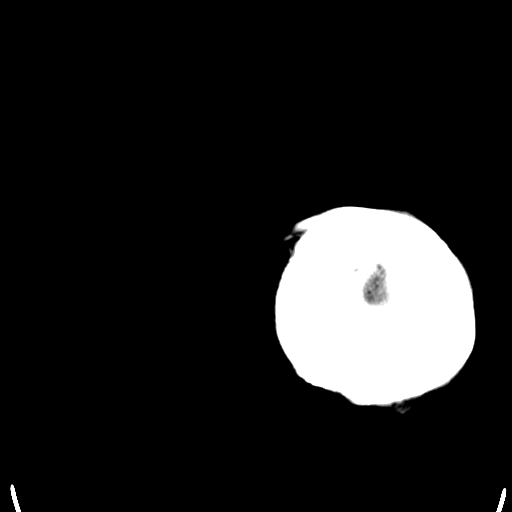

[Series 3: bone windows · axial · 0.45mm/px · z∈[-299,-209]mm · 6 of 33 slices shown]
[im 3/33  bone]
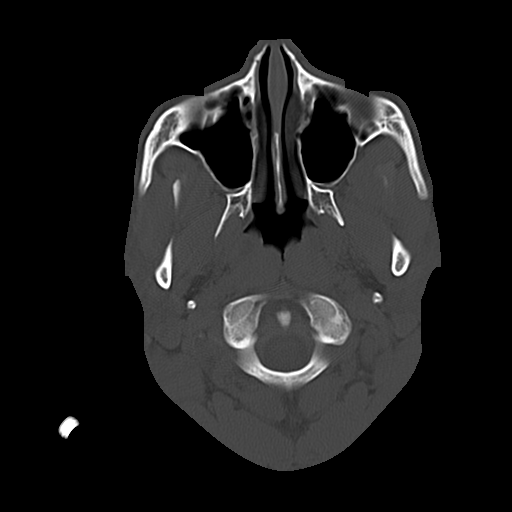
[im 6/33  bone]
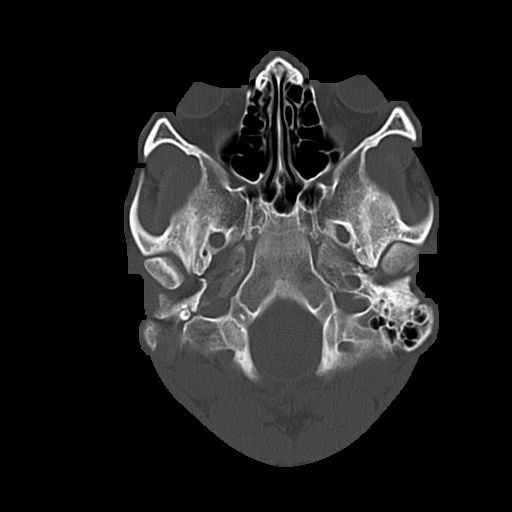
[im 12/33  bone]
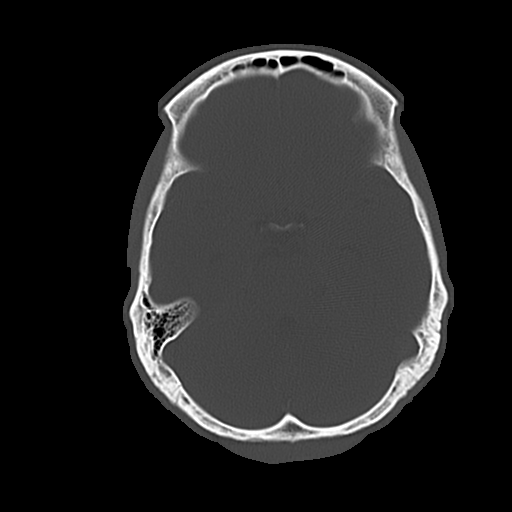
[im 15/33  bone]
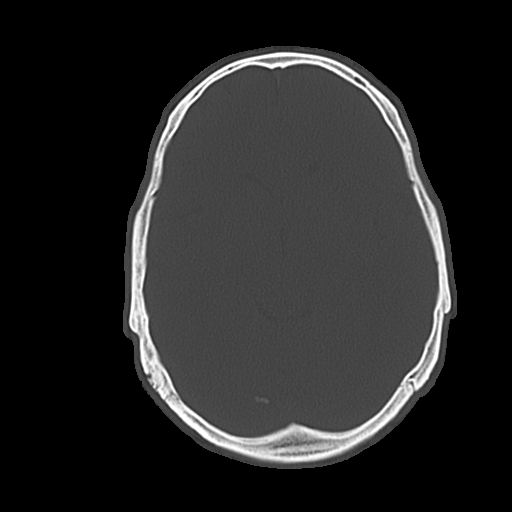
[im 18/33  bone]
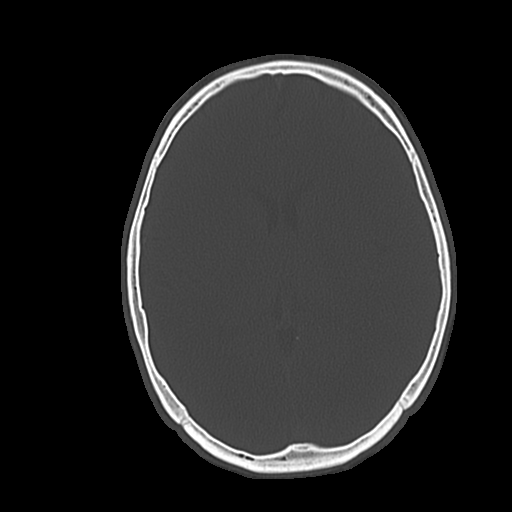
[im 21/33  bone]
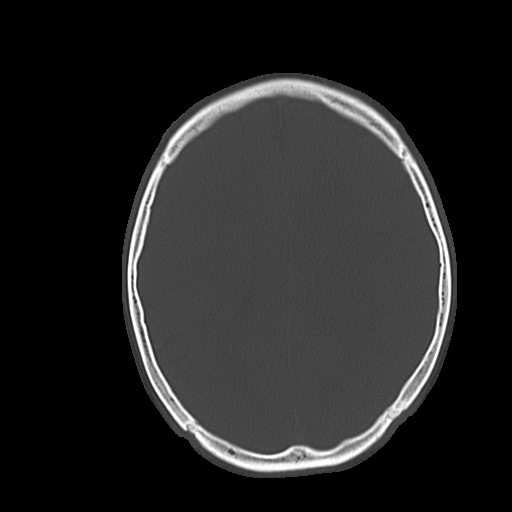

[16 of 30 positions shown; findings below may reference images not displayed]

FINDINGS: The ventricles and sulci are normal. No intraparenchymal hemorrhage,
mass effect nor midline shift. Suspected cortical thinning of the
right parietal lobe, for example axial 21/33. No acute large
vascular territory infarcts.

No abnormal extra-axial fluid collections. Basal cisterns are
patent.

No skull fracture. The included ocular globes and orbital contents
are non-suspicious. The mastoid aircells and included paranasal
sinuses are well-aerated.
IMPRESSION: Motion degraded examination, no acute intracranial process.

Suspected cortical thinning of the right parietal lobe, this may
reflect encephalomalacia/remote traumatic etiology. Findings would
be better characterized on MRI of the brain on a nonemergent basis.

  By: Arie Curbelo

## 2016-01-09 ENCOUNTER — Encounter (HOSPITAL_COMMUNITY): Payer: Self-pay

## 2016-01-09 ENCOUNTER — Emergency Department (HOSPITAL_COMMUNITY)
Admission: EM | Admit: 2016-01-09 | Discharge: 2016-01-09 | Disposition: A | Discharge status: Home / Self Care | Payer: Self-pay | Attending: Emergency Medicine | Admitting: Emergency Medicine

## 2016-01-09 DIAGNOSIS — Z79899 Other long term (current) drug therapy: Secondary | ICD-10-CM | POA: Insufficient documentation

## 2016-01-09 DIAGNOSIS — G40909 Epilepsy, unspecified, not intractable, without status epilepticus: Secondary | ICD-10-CM | POA: Insufficient documentation

## 2016-01-09 DIAGNOSIS — R569 Unspecified convulsions: Secondary | ICD-10-CM

## 2016-01-09 DIAGNOSIS — F17290 Nicotine dependence, other tobacco product, uncomplicated: Secondary | ICD-10-CM | POA: Insufficient documentation

## 2016-01-09 LAB — I-STAT CHEM 8, ED
BUN: 5 mg/dL — ABNORMAL LOW (ref 6–20)
CALCIUM ION: 1.06 mmol/L — AB (ref 1.15–1.40)
CHLORIDE: 107 mmol/L (ref 101–111)
Creatinine, Ser: 0.8 mg/dL (ref 0.61–1.24)
Glucose, Bld: 85 mg/dL (ref 65–99)
HCT: 48 % (ref 39.0–52.0)
Hemoglobin: 16.3 g/dL (ref 13.0–17.0)
Potassium: 5.2 mmol/L — ABNORMAL HIGH (ref 3.5–5.1)
Sodium: 141 mmol/L (ref 135–145)
TCO2: 26 mmol/L (ref 0–100)

## 2016-01-09 LAB — VALPROIC ACID LEVEL

## 2016-01-09 MED ORDER — VALPROATE SODIUM 500 MG/5ML IV SOLN
500.0000 mg | Freq: Once | INTRAVENOUS | Status: AC
  Administered 2016-01-09: 500 mg via INTRAVENOUS
  Filled 2016-01-09: qty 5

## 2016-01-09 MED ORDER — SODIUM CHLORIDE 0.9 % IV BOLUS (SEPSIS)
500.0000 mL | Freq: Once | INTRAVENOUS | Status: AC
  Administered 2016-01-09: 500 mL via INTRAVENOUS

## 2016-01-09 MED ORDER — KETOROLAC TROMETHAMINE 30 MG/ML IJ SOLN
30.0000 mg | Freq: Once | INTRAMUSCULAR | Status: AC
  Administered 2016-01-09: 30 mg via INTRAVENOUS
  Filled 2016-01-09: qty 1

## 2016-01-09 NOTE — ED Provider Notes (Signed)
Emergency Department Provider Note   I have reviewed the triage vital signs and the nursing notes.   HISTORY  Chief Complaint Seizures   HPI Jack Suarez is a 25 y.o. male with past nuchal history of known seizure disorder presents to the emergency department for evaluation of breakthrough seizure. The patient reports taking Keppra and Depakote with last seizure approximately one year prior. Patient does not recall the event but was told he was sitting down and had approximately 1 minute tonic-clonic seizure. Afterwards the patient notes a diffuse headache which is typical of his postictal period. Family at bedside reports that he was confused for a brief period of time but is since come back to his normal self faster than normal. Patient denies any tongue trauma or incontinence. No recent fevers. States he has been compliant with his medications. No sickness or drug use. Denies shoulder pain.    Past Medical History:  Diagnosis Date  . Seizures [facility])     Patient Active Problem List   Diagnosis Date Noted  . Seizures (HCC) 12/17/2013  . Leucocytosis 12/17/2013  . Encephalopathy acute 12/17/2013    No past surgical history on file.  Current Outpatient Rx  . Order #: [id number] Class: Normal  . Order #: [id number] Class: Normal    Allergies Review of patient's allergies indicates no known allergies.  Family History  Problem Relation Age of Onset  . Seizures Father     Social History Social History  Substance Use Topics  . Smoking status: Current Every Day Smoker    Types: Cigars  . Smokeless tobacco: Never Used  . Alcohol use Yes    Review of Systems  Constitutional: No fever/chills Eyes: No visual changes. ENT: No sore throat. Cardiovascular: Denies chest pain. Respiratory: Denies shortness of breath. Gastrointestinal: No abdominal pain.  No nausea, no vomiting.  No diarrhea.  No constipation. Genitourinary: Negative for dysuria. Musculoskeletal:  Negative for back pain. Skin: Negative for rash. Neurological: Negative for headaches, focal weakness or numbness. Breakthrough seizure today.   10-point ROS otherwise negative.  ____________________________________________   PHYSICAL EXAM:  VITAL SIGNS: ED Triage Vitals  Enc Vitals Group     BP 01/09/16 1324 108/59     Pulse Rate 01/09/16 1324 89     Resp 01/09/16 1324 14     Temp 01/09/16 1324 98 F (36.7 C)     Temp Source 01/09/16 1324 Oral     SpO2 01/09/16 1324 94 %     Weight 01/09/16 1324 177 lb (80.3 kg)     Height 01/09/16 1324 6' (1.829 m)     Pain Score 01/09/16 1336 8   Constitutional: Alert and oriented. Well appearing and in no acute distress. Eyes: Conjunctivae are normal. PERRL. EOMI. Head: Atraumatic. Nose: No congestion/rhinnorhea. Mouth/Throat: Mucous membranes are moist.  Oropharynx non-erythematous. No tongue lacerations or abrasions.  Neck: No stridor.  Cardiovascular: Normal rate, regular rhythm. Good peripheral circulation. Grossly normal heart sounds.   Respiratory: Normal respiratory effort.  No retractions. Lungs CTAB. Gastrointestinal: Soft and nontender. No distention.  Musculoskeletal: No lower extremity tenderness nor edema. No gross deformities of extremities. Neurologic:  Normal speech and language. No gross focal neurologic deficits are appreciated.  Skin:  Skin is warm, dry and intact. No rash noted. Psychiatric: Mood and affect are normal. Speech and behavior are normal.  ____________________________________________   LABS (all labs ordered are listed, but only abnormal results are displayed)  Labs Reviewed  VALPROIC ACID LEVEL - Abnormal; Notable for the  following:       Result Value   Valproic Acid Lvl <10 (*)    All other components within normal limits  I-STAT CHEM 8, ED - Abnormal; Notable for the following:    Potassium 5.2 (*)    BUN 5 (*)    Calcium, Ion 1.06 (*)    All other components within normal limits    ____________________________________________  RADIOLOGY  None ____________________________________________   PROCEDURES  Procedure(s) performed:   Procedures  None ____________________________________________   INITIAL IMPRESSION / ASSESSMENT AND PLAN / ED COURSE  Pertinent labs & imaging results that were available during my care of the patient were reviewed by me and considered in my medical decision making (see chart for details).  Patient with known seizure disorder presents to the emergency department for evaluation of breakthrough seizure. On Keppra and Depakote. The patient is awake and alert with no neurological deficits. He is complaining of HA typical of his post-ictal period. Nothing new or different about HA. Will send chemistry and depakote level.   03:56 PM Patient's valproic acid level is less than 10. After questioning he states he just started taking his medications again as he is supposed to but is not expected to be that low. We'll give valproic acid load here in the emergency department and discharged with neurology follow-up. Patient states his neurologist is in [place] and he will call him tomorrow. He is returned to his mental status baseline. Headache resolved. Plan for discharge after IV medication. He does not need refills of current seizure medications.   ____________________________________________  FINAL CLINICAL IMPRESSION(S) / ED DIAGNOSES  Final diagnoses:  Seizure (HCC)     MEDICATIONS GIVEN DURING THIS VISIT:  Medications  sodium chloride 0.9 % bolus 500 mL (0 mLs Intravenous Stopped 01/09/16 1752)  ketorolac (TORADOL) 30 MG/ML injection 30 mg (30 mg Intravenous Given 01/09/16 1427)  valproate (DEPACON) 500 mg in dextrose 5 % 50 mL IVPB (0 mg Intravenous Stopped 01/09/16 1752)     NEW OUTPATIENT MEDICATIONS STARTED DURING THIS VISIT:  None   Note:  This document was prepared using Dragon voice recognition software and may include  unintentional dictation errors.  Alona BeneJoshua Long, MD Emergency Medicine   Maia PlanJoshua G Long, MD 01/09/16 2051

## 2016-01-09 NOTE — ED Notes (Signed)
Patient was alert, oriented and stable upon discharge. RN went over AVS and patient had no further questions.  

## 2016-01-09 NOTE — ED Notes (Signed)
Bed: [id number] Expected date:  Expected time:  Means of arrival:  Comments: EMS seizure/ok now

## 2016-01-09 NOTE — ED Triage Notes (Signed)
He was seen by his family to have tonic/clonic seizure. He arrives here awake, alert and in no distress. He tells me he is on Keppra and Depakote; and that his meds have not needed adjustment for about a year  Now.

## 2016-01-09 NOTE — Discharge Instructions (Signed)

## 2016-02-20 ENCOUNTER — Emergency Department (HOSPITAL_BASED_OUTPATIENT_CLINIC_OR_DEPARTMENT_OTHER)
Admission: EM | Admit: 2016-02-20 | Discharge: 2016-02-20 | Disposition: A | Discharge status: Home / Self Care | Payer: Self-pay | Attending: Emergency Medicine | Admitting: Emergency Medicine

## 2016-02-20 ENCOUNTER — Encounter (HOSPITAL_BASED_OUTPATIENT_CLINIC_OR_DEPARTMENT_OTHER): Payer: Self-pay | Admitting: *Deleted

## 2016-02-20 DIAGNOSIS — F17290 Nicotine dependence, other tobacco product, uncomplicated: Secondary | ICD-10-CM | POA: Insufficient documentation

## 2016-02-20 DIAGNOSIS — R569 Unspecified convulsions: Secondary | ICD-10-CM

## 2016-02-20 DIAGNOSIS — Z79899 Other long term (current) drug therapy: Secondary | ICD-10-CM | POA: Insufficient documentation

## 2016-02-20 DIAGNOSIS — G40909 Epilepsy, unspecified, not intractable, without status epilepticus: Secondary | ICD-10-CM | POA: Insufficient documentation

## 2016-02-20 LAB — CBC WITH DIFFERENTIAL/PLATELET
BASOS PCT: 0 %
Basophils Absolute: 0 10*3/uL (ref 0.0–0.1)
EOS ABS: 0 10*3/uL (ref 0.0–0.7)
Eosinophils Relative: 0 %
HCT: 45.7 % (ref 39.0–52.0)
HEMOGLOBIN: 15.5 g/dL (ref 13.0–17.0)
Lymphocytes Relative: 19 %
Lymphs Abs: 1.4 10*3/uL (ref 0.7–4.0)
MCH: 31.1 pg (ref 26.0–34.0)
MCHC: 33.9 g/dL (ref 30.0–36.0)
MCV: 91.8 fL (ref 78.0–100.0)
MONO ABS: 0.7 10*3/uL (ref 0.1–1.0)
MONOS PCT: 10 %
NEUTROS PCT: 71 %
Neutro Abs: 5.3 10*3/uL (ref 1.7–7.7)
Platelets: 157 10*3/uL (ref 150–400)
RBC: 4.98 MIL/uL (ref 4.22–5.81)
RDW: 13 % (ref 11.5–15.5)
WBC: 7.5 10*3/uL (ref 4.0–10.5)

## 2016-02-20 LAB — RAPID URINE DRUG SCREEN, HOSP PERFORMED
AMPHETAMINES: NOT DETECTED
Barbiturates: NOT DETECTED
Benzodiazepines: NOT DETECTED
COCAINE: NOT DETECTED
OPIATES: NOT DETECTED
TETRAHYDROCANNABINOL: POSITIVE — AB

## 2016-02-20 LAB — URINE MICROSCOPIC-ADD ON

## 2016-02-20 LAB — COMPREHENSIVE METABOLIC PANEL
ALBUMIN: 4.1 g/dL (ref 3.5–5.0)
ALT: 11 U/L — ABNORMAL LOW (ref 17–63)
ANION GAP: 9 (ref 5–15)
AST: 27 U/L (ref 15–41)
Alkaline Phosphatase: 39 U/L (ref 38–126)
BUN: 7 mg/dL (ref 6–20)
CHLORIDE: 105 mmol/L (ref 101–111)
CO2: 24 mmol/L (ref 22–32)
Calcium: 9.1 mg/dL (ref 8.9–10.3)
Creatinine, Ser: 0.97 mg/dL (ref 0.61–1.24)
GFR calc Af Amer: >60 mL/min (ref >60)
GFR calc non Af Amer: >60 mL/min (ref >60)
GLUCOSE: 100 mg/dL — AB (ref 65–99)
POTASSIUM: 3.9 mmol/L (ref 3.5–5.1)
SODIUM: 138 mmol/L (ref 135–145)
Total Bilirubin: 0.5 mg/dL (ref 0.3–1.2)
Total Protein: 7.2 g/dL (ref 6.5–8.1)

## 2016-02-20 LAB — URINALYSIS, ROUTINE W REFLEX MICROSCOPIC
Bilirubin Urine: NEGATIVE
Glucose, UA: NEGATIVE mg/dL
Ketones, ur: NEGATIVE mg/dL
NITRITE: NEGATIVE
Protein, ur: 100 mg/dL — AB
SPECIFIC GRAVITY, URINE: 1.017 (ref 1.005–1.030)
pH: 6.5 (ref 5.0–8.0)

## 2016-02-20 LAB — MAGNESIUM: Magnesium: 2.2 mg/dL (ref 1.7–2.4)

## 2016-02-20 LAB — VALPROIC ACID LEVEL: Valproic Acid Lvl: <10 ug/mL — ABNORMAL LOW (ref 50.0–100.0)

## 2016-02-20 MED ORDER — DIVALPROEX SODIUM 500 MG PO DR TAB
500.0000 mg | DELAYED_RELEASE_TABLET | Freq: Once | ORAL | Status: DC
Start: 2016-02-20 — End: 2016-02-20
  Filled 2016-02-20: qty 1

## 2016-02-20 MED ORDER — VALPROATE SODIUM 500 MG/5ML IV SOLN
20.0000 mg/kg | Freq: Once | INTRAVENOUS | Status: DC
Start: 2016-02-20 — End: 2016-02-20
  Filled 2016-02-20: qty 16.06

## 2016-02-20 MED ORDER — VALPROIC ACID 250 MG PO CAPS
500.0000 mg | ORAL_CAPSULE | Freq: Once | ORAL | Status: AC
  Administered 2016-02-20: 500 mg via ORAL
  Filled 2016-02-20: qty 2

## 2016-02-20 MED ORDER — KETOROLAC TROMETHAMINE 30 MG/ML IJ SOLN
30.0000 mg | Freq: Once | INTRAMUSCULAR | Status: AC
  Administered 2016-02-20: 30 mg via INTRAVENOUS
  Filled 2016-02-20: qty 1

## 2016-02-20 MED ORDER — SODIUM CHLORIDE 0.9 % IV BOLUS (SEPSIS)
1000.0000 mL | Freq: Once | INTRAVENOUS | Status: AC
  Administered 2016-02-20: 1000 mL via INTRAVENOUS
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
## 2016-02-20 NOTE — Discharge Instructions (Signed)
Please take your Depakote at home as directed. Please try to get sleep as this may have caused your seizure today. Please stay hydrated and follow-up with your PCP and neurology team. If symptoms return or worsen, please return to the nearest ED.

## 2016-02-20 NOTE — ED Notes (Signed)
cbg 99 per EMS during transport
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
## 2016-07-31 ENCOUNTER — Encounter (HOSPITAL_COMMUNITY): Payer: Self-pay | Admitting: Emergency Medicine

## 2016-07-31 ENCOUNTER — Emergency Department (HOSPITAL_COMMUNITY)
Admission: EM | Admit: 2016-07-31 | Discharge: 2016-07-31 | Disposition: A | Discharge status: Home / Self Care | Payer: BLUE CROSS/BLUE SHIELD | Attending: Emergency Medicine | Admitting: Emergency Medicine

## 2016-07-31 DIAGNOSIS — F17290 Nicotine dependence, other tobacco product, uncomplicated: Secondary | ICD-10-CM | POA: Insufficient documentation

## 2016-07-31 DIAGNOSIS — Z8679 Personal history of other diseases of the circulatory system: Secondary | ICD-10-CM

## 2016-07-31 DIAGNOSIS — R569 Unspecified convulsions: Secondary | ICD-10-CM

## 2016-07-31 DIAGNOSIS — I4891 Unspecified atrial fibrillation: Secondary | ICD-10-CM | POA: Insufficient documentation

## 2016-07-31 DIAGNOSIS — Z79899 Other long term (current) drug therapy: Secondary | ICD-10-CM | POA: Diagnosis not present

## 2016-07-31 LAB — BASIC METABOLIC PANEL
Anion gap: 7 (ref 5–15)
BUN: 9 mg/dL (ref 6–20)
CO2: 24 mmol/L (ref 22–32)
Calcium: 8.8 mg/dL — ABNORMAL LOW (ref 8.9–10.3)
Chloride: 109 mmol/L (ref 101–111)
Creatinine, Ser: 0.85 mg/dL (ref 0.61–1.24)
GFR calc Af Amer: >60 mL/min (ref >60)
GFR calc non Af Amer: >60 mL/min (ref >60)
Glucose, Bld: 77 mg/dL (ref 65–99)
Potassium: 3.9 mmol/L (ref 3.5–5.1)
Sodium: 140 mmol/L (ref 135–145)

## 2016-07-31 LAB — CBG MONITORING, ED: GLUCOSE-CAPILLARY: 97 mg/dL (ref 65–99)

## 2016-07-31 LAB — VALPROIC ACID LEVEL: Valproic Acid Lvl: 108 ug/mL — ABNORMAL HIGH (ref 50.0–100.0)

## 2016-07-31 MED ORDER — LEVETIRACETAM 500 MG PO TABS
1000.0000 mg | ORAL_TABLET | Freq: Once | ORAL | Status: AC
  Administered 2016-07-31: 1000 mg via ORAL
  Filled 2016-07-31: qty 2

## 2016-07-31 MED ORDER — SODIUM CHLORIDE 0.9 % IV SOLN
1000.0000 mg | Freq: Once | INTRAVENOUS | Status: DC
  Filled 2016-07-31: qty 10

## 2016-07-31 NOTE — ED Notes (Signed)
ED Provider at bedside. 

## 2016-07-31 NOTE — ED Notes (Signed)
Attempt IV to R hand unsuccessful. With insertion of IV to L hand pt pulling back while this writer attempting to tape down/stable IV site; loss of IV access. PA notified and verbalizes will place oral medications to replace ordered infusion.

## 2016-07-31 NOTE — ED Notes (Signed)
Bed: RESB Expected date:  Expected time:  Means of arrival:  Comments: EMS ? seizure 

## 2016-07-31 NOTE — ED Notes (Signed)
Pt ambulated independently with steady gait.

## 2016-07-31 NOTE — ED Triage Notes (Signed)
Per EMS pt witnessed seizure lasting 2 minutes; no injuries. Pt complaint of severe headache at present time. Hx of seizures; complaint with medications. Pt alert and oriented x4 at present time.

## 2016-07-31 NOTE — ED Provider Notes (Signed)
[facility]-EMERGENCY DEPT Provider Note   CSN: [id number] Arrival date & time: 07/31/16  1031     History   Chief Complaint Chief Complaint  Patient presents with  . Seizures    HPI Jack Suarez is a 26 y.o. male with history of seizure disorder who presents following a seizure. Patient states he was in court today when he began having a seizure typical of his normal. He had a full body convulsive seizure, per mother who witnessed the episode. It lasted around 1-2 minutes. Patient had a postictal state for 15-20 minutes. Patient states he has a headache at the time, however this is normal for him after seizures. Patient states that he did not get very much sleep last night, and that may be why he had a seizure. His last seizure was around 2 weeks ago. Patient notes using marijuana 2 days ago, however no other drug or alcohol use. Patient denies any numbness or tingling. No fevers, chest pain, shortness of breath, abdominal pain, nausea, vomiting.  HPI  Past Medical History:  Diagnosis Date  . Seizures [facility])     Patient Active Problem List   Diagnosis Date Noted  . Seizures (HCC) 12/17/2013  . Leucocytosis 12/17/2013  . Encephalopathy acute 12/17/2013    History reviewed. No pertinent surgical history.     Home Medications    Prior to Admission medications   Medication Sig Start Date End Date Taking? Authorizing Provider  divalproex (DEPAKOTE) 500 MG DR tablet Take 1,500 mg by mouth 2 (two) times daily.   Yes Historical Provider, MD  levETIRAcetam (KEPPRA) 1000 MG tablet Take 2,000 mg by mouth 2 (two) times daily.    Yes Historical Provider, MD    Family History Family History  Problem Relation Age of Onset  . Seizures Father     Social History Social History  Substance Use Topics  . Smoking status: Current Every Day Smoker    Types: Cigars  . Smokeless tobacco: Never Used  . Alcohol use Yes     Allergies   Patient has no known allergies.   Review of  Systems Review of Systems  Constitutional: Negative for chills and fever.  HENT: Negative for facial swelling and sore throat.   Respiratory: Negative for shortness of breath.   Cardiovascular: Negative for chest pain.  Gastrointestinal: Negative for abdominal pain, nausea and vomiting.  Genitourinary: Negative for dysuria.  Musculoskeletal: Negative for back pain.  Skin: Negative for rash and wound.  Neurological: Positive for seizures and headaches.  Psychiatric/Behavioral: The patient is not nervous/anxious.      Physical Exam Updated Vital Signs BP 115/72 (BP Location: Right Arm)   Pulse 62   Temp 98 F (36.7 C) (Oral)   Resp 16   Wt 80.3 kg   SpO2 99%   BMI 24.01 kg/m   Physical Exam  Constitutional: He appears well-developed and well-nourished. No distress.  Sleepy  HENT:  Head: Normocephalic and atraumatic.  Mouth/Throat: Oropharynx is clear and moist. No oropharyngeal exudate.  No tongue injury  Eyes: Conjunctivae are normal. Pupils are equal, round, and reactive to light. Right eye exhibits no discharge. Left eye exhibits no discharge. No scleral icterus.  Neck: Normal range of motion. Neck supple. No thyromegaly present.  Cardiovascular: Normal rate, regular rhythm, normal heart sounds and intact distal pulses.  Exam reveals no gallop and no friction rub.   No murmur heard. Pulmonary/Chest: Effort normal and breath sounds normal. No stridor. No respiratory distress. He has no wheezes. He  has no rales.  Abdominal: Soft. Bowel sounds are normal. He exhibits no distension. There is no tenderness. There is no rebound and no guarding.  Musculoskeletal: He exhibits no edema.  Lymphadenopathy:    He has no cervical adenopathy.  Neurological: He is alert. Coordination normal.  CN 3-12 intact; normal sensation throughout; 5/5 strength in all 4 extremities; equal bilateral grip strength  Skin: Skin is warm and dry. No rash noted. He is not diaphoretic. No pallor.    Psychiatric: He has a normal mood and affect.  Nursing note and vitals reviewed.    ED Treatments / Results  Labs (all labs ordered are listed, but only abnormal results are displayed) Labs Reviewed  BASIC METABOLIC PANEL - Abnormal; Notable for the following:       Result Value   Calcium 8.8 (*)    All other components within normal limits  VALPROIC ACID LEVEL - Abnormal; Notable for the following:    Valproic Acid Lvl 108 (*)    All other components within normal limits  CBG MONITORING, ED    EKG  EKG Interpretation  Date/Time:  Monday July 31 2016 14:25:47 EDT Ventricular Rate:  58 PR Interval:    QRS Duration: 84 QT Interval:  365 QTC Calculation: 359 R Axis:   95 Text Interpretation:  Sinus rhythm Prolonged PR interval Borderline right axis deviation Nonspecific T abnrm, anterolateral leads ST elev, probable normal early repol pattern Since last tracing of earlier today now in normal sinus rhythym Confirmed by [facility]  MD, ELLIOTT [phone]) on 07/31/2016 2:38:12 PM       EKG Interpretation  Date/Time:  Monday July 31 2016 14:25:47 EDT Ventricular Rate:  58 PR Interval:    QRS Duration: 84 QT Interval:  365 QTC Calculation: 359 R Axis:   95 Text Interpretation:  Sinus rhythm Prolonged PR interval Borderline right axis deviation Nonspecific T abnrm, anterolateral leads ST elev, probable normal early repol pattern Since last tracing of earlier today now in normal sinus rhythym Confirmed by [facility]  MD, Mechele Collin ([id number]) on 07/31/2016 2:38:12 PM         Radiology No results found.  Procedures Procedures (including critical care time)  Medications Ordered in ED Medications  levETIRAcetam (KEPPRA) tablet 1,000 mg (1,000 mg Oral Given 07/31/16 1254)     Initial Impression / Assessment and Plan / ED Course  I have reviewed the triage vital signs and the nursing notes.  Pertinent labs & imaging results that were available during my care of the patient were reviewed  by me and considered in my medical decision making (see chart for details).     Patient with seizure disorder who presented following seizure. Seizure was typical of patient's normal seizure as well as postictal state and headache. Patient's headache improved throughout ED course. Patient had episode of atrial fibrillation in the ED which returned to normal sinus rhythm without any intervention. Patient's rate was within normal range at the time. ChadsVasc 0. Orthostatic vitals stable. Patient refused an IV, however blood pressure improved and oral fluids given. I have advised follow-up to cardiology to patient and his mother. I also advise follow-up to neurology for further evaluation of more frequent seizures. Labs today are unremarkable, Depakote level stable. Return precautions discussed. Patient understands and agrees with plan. Patient vitals stable throughout ED course and discharged in satisfactory condition. I discussed patient case with Dr. Effie Shy who guided the patient's management and agrees with plan.   Final Clinical Impressions(s) / ED Diagnoses  Final diagnoses:  Seizure [facility])  Atrial fibrillation, currently in sinus rhythm    New Prescriptions New Prescriptions   No medications on file     Emi Holes, PA-C 07/31/16 [address] Ajooni Karam, PA-C 07/31/16 [id number]    Mancel Bale, MD 07/31/16 2113

## 2016-07-31 NOTE — Discharge Instructions (Signed)
Continue taking your seizure medications as prescribed. Please follow-up with your neurologist for follow-up of today's visit and further evaluation and treatment of your increased seizure activity lately. Please follow-up with a cardiologist as soon as possible for further evaluation and treatment of your episode of atrial fibrillation. This episode resolved in the emergency department, however it is possible that you could go into this abnormal heart rhythm again. Please return to emergency department if you develop any new or worsening symptoms including racing heart, passing out, chest pain, shortness of breath, or any other concerning symptoms.
